# Patient Record
Sex: Female | Born: 1960 | Race: Black or African American | Hispanic: No | Marital: Single | Smoking: Never smoker
Health system: Southern US, Community
[De-identification: ages and names within clinical notes are randomized; demographics above are authoritative.]

## PROBLEM LIST (undated history)

## (undated) DIAGNOSIS — M1990 Unspecified osteoarthritis, unspecified site: Secondary | ICD-10-CM

## (undated) DIAGNOSIS — G43909 Migraine, unspecified, not intractable, without status migrainosus: Secondary | ICD-10-CM

## (undated) DIAGNOSIS — R7303 Prediabetes: Secondary | ICD-10-CM

---

## 1980-03-23 HISTORY — PX: BREAST SURGERY: SHX581

## 1997-12-26 ENCOUNTER — Emergency Department (HOSPITAL_COMMUNITY): Admission: EM | Admit: 1997-12-26 | Discharge: 1997-12-27 | Payer: Self-pay | Admitting: Emergency Medicine

## 1998-11-12 ENCOUNTER — Ambulatory Visit (HOSPITAL_COMMUNITY): Admission: RE | Admit: 1998-11-12 | Discharge: 1998-11-12 | Payer: Self-pay | Admitting: Obstetrics and Gynecology

## 1998-11-12 ENCOUNTER — Encounter: Payer: Self-pay | Admitting: Obstetrics and Gynecology

## 2000-05-28 ENCOUNTER — Encounter: Payer: Self-pay | Admitting: Obstetrics

## 2000-05-28 ENCOUNTER — Encounter: Admission: RE | Admit: 2000-05-28 | Discharge: 2000-05-28 | Payer: Self-pay | Admitting: Obstetrics

## 2001-11-16 ENCOUNTER — Encounter: Admission: RE | Admit: 2001-11-16 | Discharge: 2002-02-14 | Payer: Self-pay | Admitting: Obstetrics

## 2002-07-04 ENCOUNTER — Other Ambulatory Visit: Admission: RE | Admit: 2002-07-04 | Discharge: 2002-07-04 | Payer: Self-pay | Admitting: Gynecology

## 2002-12-19 ENCOUNTER — Encounter: Admission: RE | Admit: 2002-12-19 | Discharge: 2002-12-19 | Payer: Self-pay | Admitting: Obstetrics

## 2002-12-19 ENCOUNTER — Encounter: Payer: Self-pay | Admitting: Obstetrics

## 2003-01-18 ENCOUNTER — Encounter: Admission: RE | Admit: 2003-01-18 | Discharge: 2003-01-18 | Payer: Self-pay | Admitting: Cardiology

## 2003-06-20 ENCOUNTER — Encounter: Admission: RE | Admit: 2003-06-20 | Discharge: 2003-06-20 | Payer: Self-pay | Admitting: Cardiology

## 2004-02-27 ENCOUNTER — Encounter: Admission: RE | Admit: 2004-02-27 | Discharge: 2004-02-27 | Payer: Self-pay | Admitting: Cardiology

## 2004-02-28 ENCOUNTER — Other Ambulatory Visit: Admission: RE | Admit: 2004-02-28 | Discharge: 2004-02-28 | Payer: Self-pay | Admitting: Gynecology

## 2004-04-30 ENCOUNTER — Encounter: Admission: RE | Admit: 2004-04-30 | Discharge: 2004-04-30 | Payer: Self-pay | Admitting: Cardiology

## 2005-02-27 ENCOUNTER — Ambulatory Visit: Payer: Self-pay | Admitting: Internal Medicine

## 2005-05-06 ENCOUNTER — Other Ambulatory Visit: Admission: RE | Admit: 2005-05-06 | Discharge: 2005-05-06 | Payer: Self-pay | Admitting: Obstetrics and Gynecology

## 2005-05-07 ENCOUNTER — Encounter: Admission: RE | Admit: 2005-05-07 | Discharge: 2005-05-07 | Payer: Self-pay | Admitting: Obstetrics and Gynecology

## 2005-06-25 ENCOUNTER — Ambulatory Visit: Payer: Self-pay | Admitting: Internal Medicine

## 2005-07-02 ENCOUNTER — Emergency Department (HOSPITAL_COMMUNITY): Admission: EM | Admit: 2005-07-02 | Discharge: 2005-07-03 | Payer: Self-pay | Admitting: Emergency Medicine

## 2005-08-20 ENCOUNTER — Ambulatory Visit: Payer: Self-pay | Admitting: Internal Medicine

## 2006-08-06 IMAGING — US US ABDOMEN COMPLETE
1 series · 14 of 25 positions shown · non-contrast
Comparison: None.

CLINICAL DATA: Abdomen and right flank pain. 
 UPPER ABDOMINAL ULTRASOUND COMPLETE:

[Series 1: unknown · 0.27mm/px · 14 of 60 slices shown]
[im 1/60]
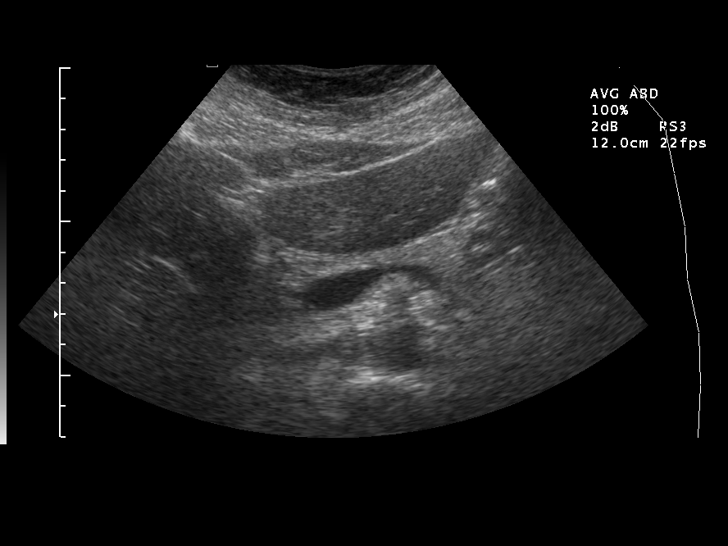
[im 5/60]
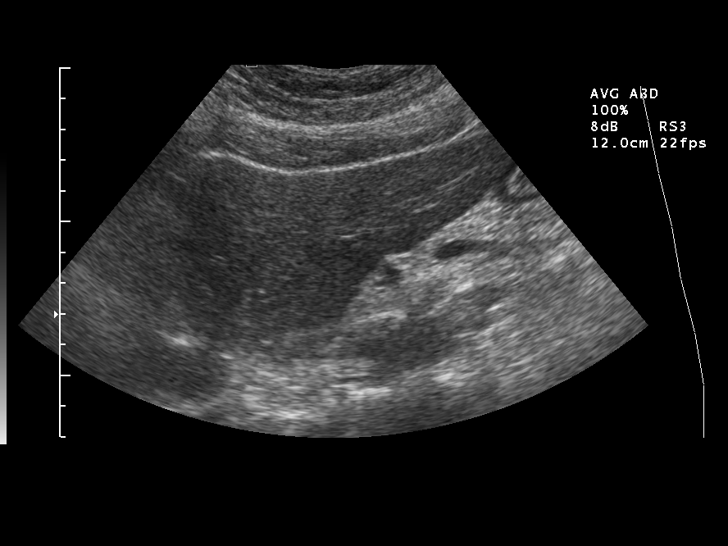
[im 10/60]
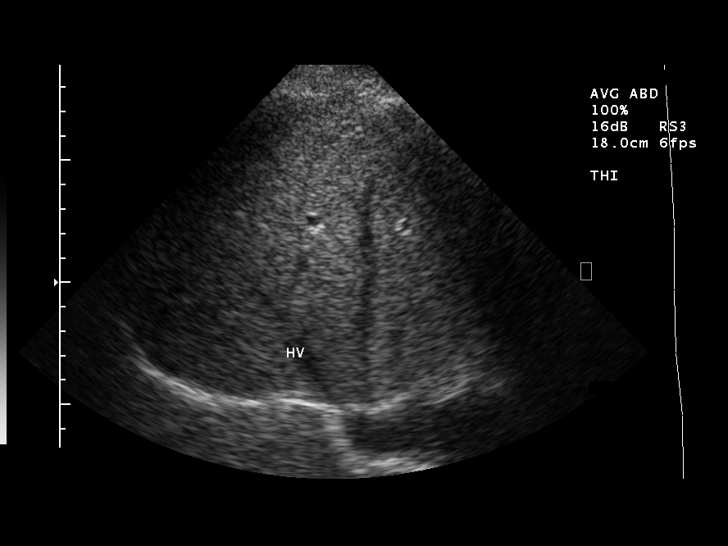
[im 15/60]
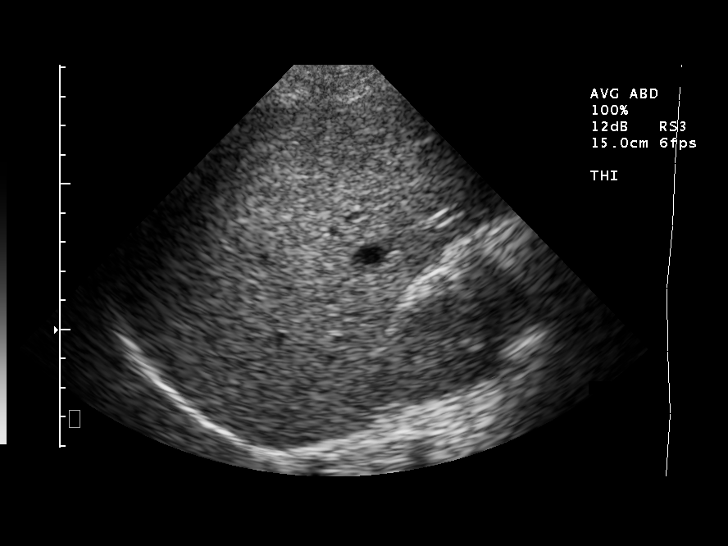
[im 20/60]
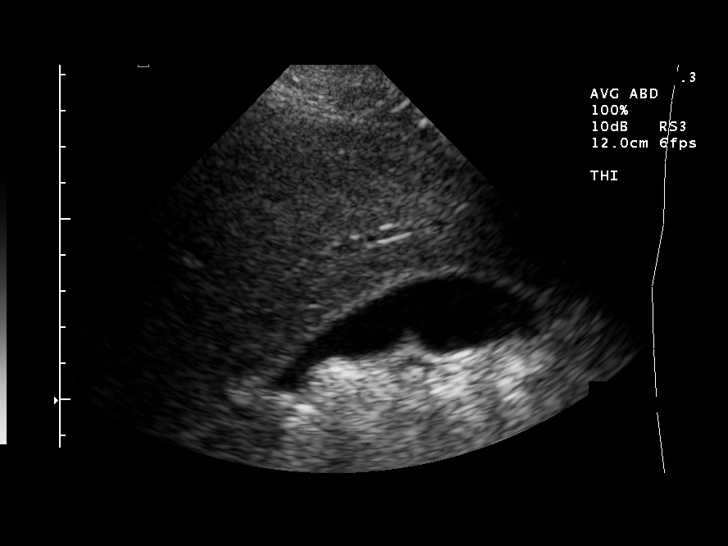
[im 23/60]
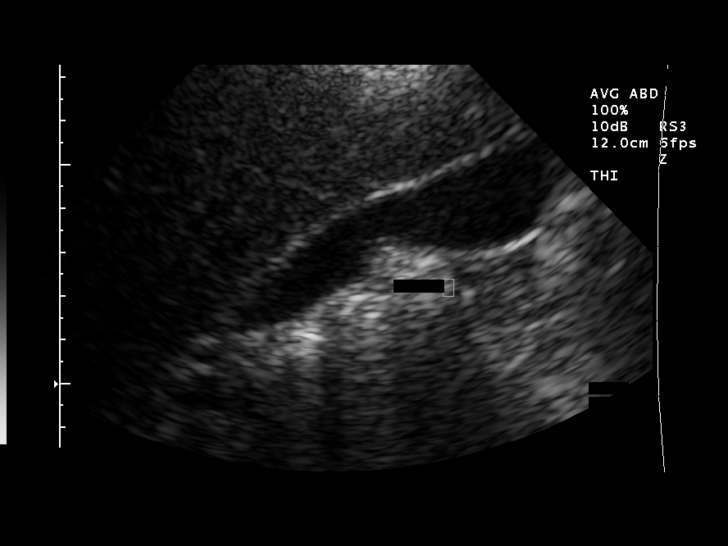
[im 28/60]
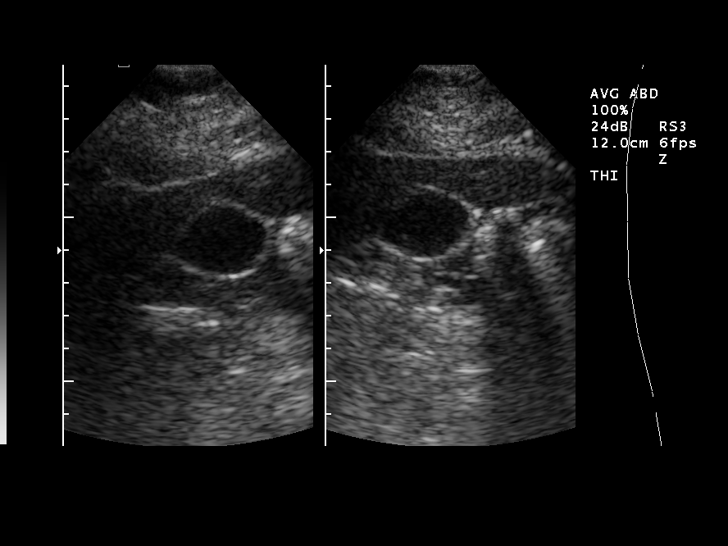
[im 32/60]
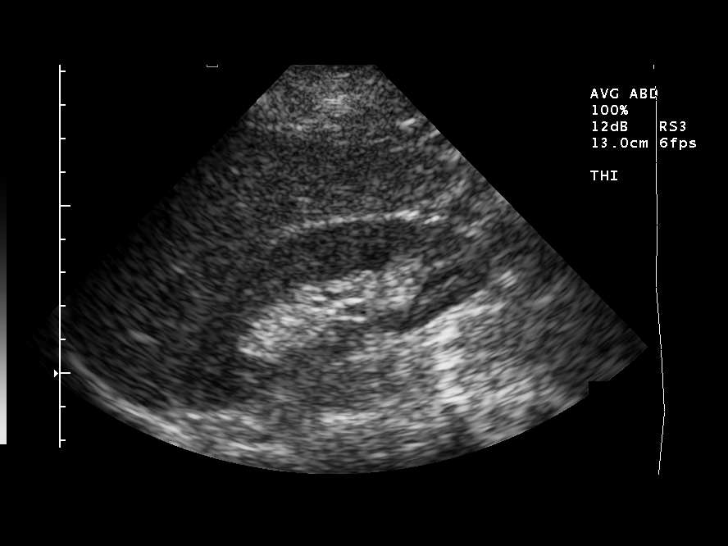
[im 37/60]
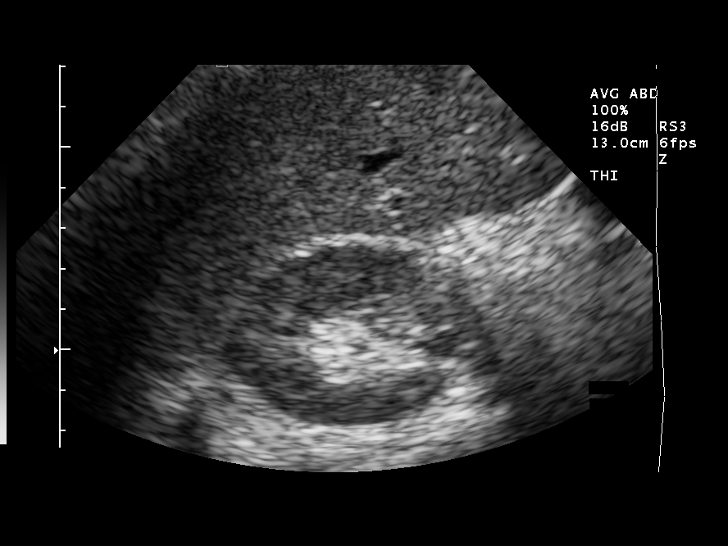
[im 40/60]
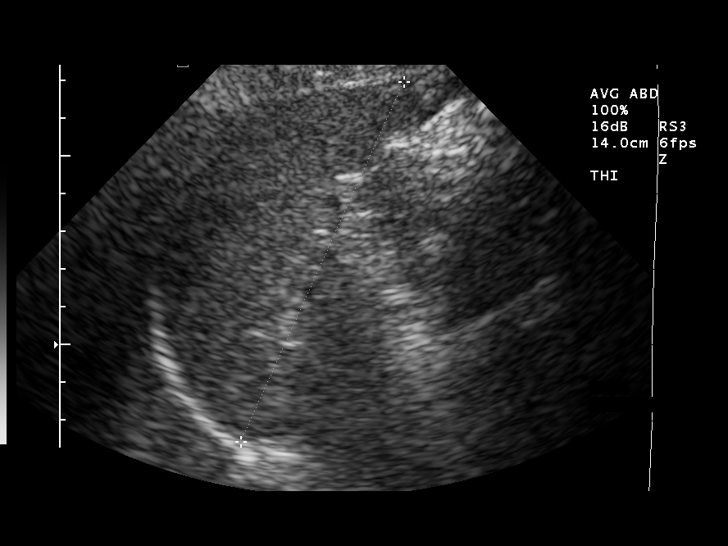
[im 45/60]
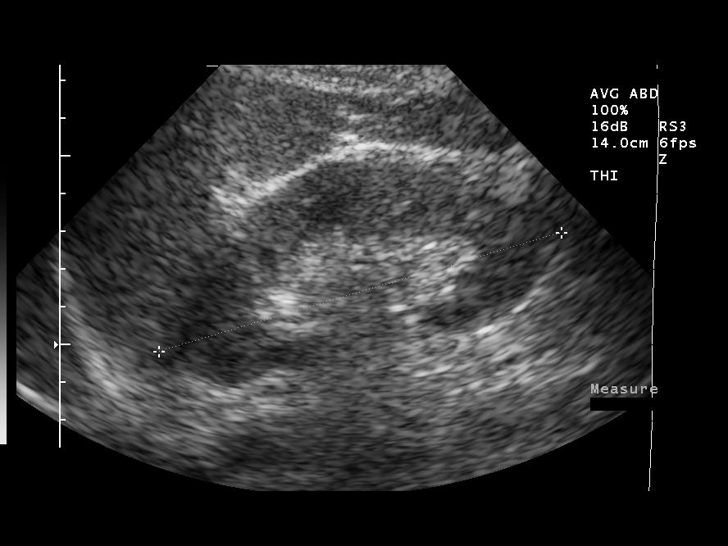
[im 50/60]
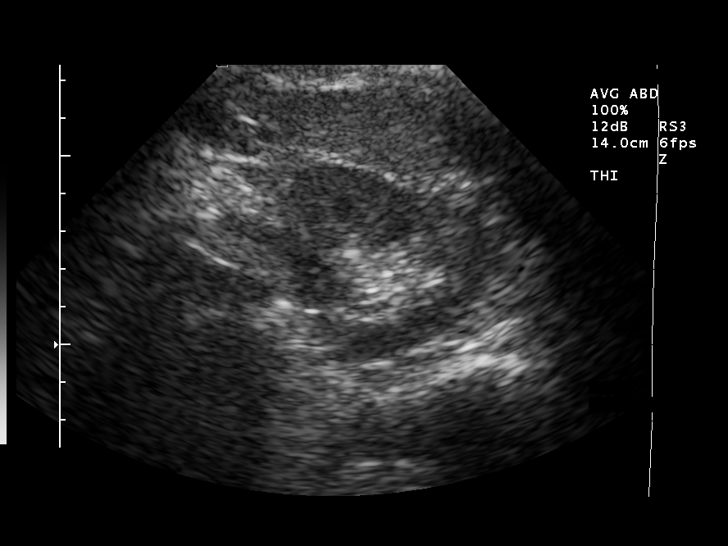
[im 55/60]
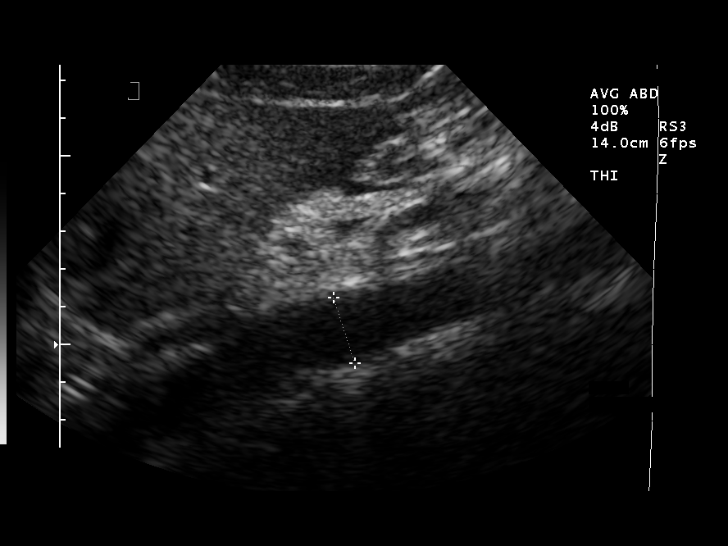
[im 60/60]
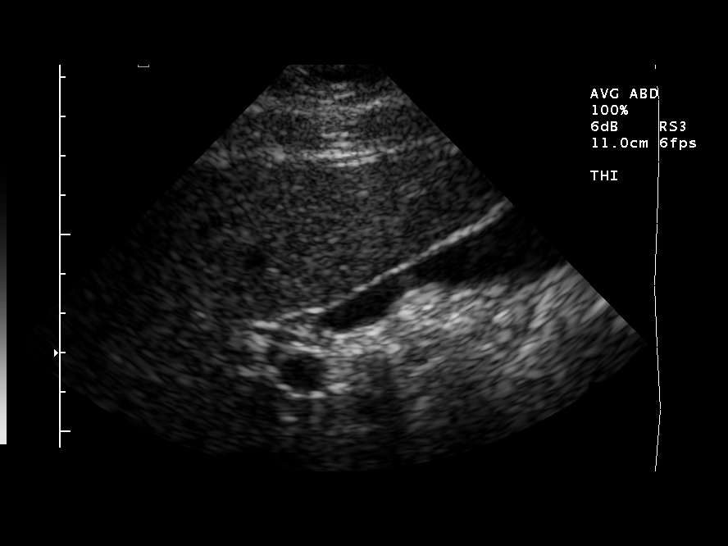

[14 of 25 positions shown; findings below may reference images not displayed]

FINDINGS: Gallbladder and biliary tree normal.  Common duct measured at 2.4 mm.  The liver, spleen, and pancreas appear normal.  Kidney size normal without stones or obstruction.  IVC and aorta normal.  No ascites.
IMPRESSION: Normal exam.

## 2007-11-26 ENCOUNTER — Emergency Department (HOSPITAL_COMMUNITY): Admission: EM | Admit: 2007-11-26 | Discharge: 2007-11-26 | Payer: Self-pay | Admitting: Emergency Medicine

## 2007-12-11 IMAGING — CT CT HEAD W/O CM
1 series · 16 of 30 positions shown, 20 images · IV contrast (agent unspecified)
Comparison: 02/27/04 MRI brain.

CLINICAL DATA: Migraine headaches, worse than normal.
 HEAD CT WITHOUT CONTRAST:
TECHNIQUE: Contiguous axial images were obtained from the base of the skull through the vertex according to standard protocol without contrast.

[Series 2: head_seq 4.5 h45s st · axial · 0.43mm/px · z∈[+1055,+1181]mm · 16 of 32 slices shown, 20 images]
[im 2/32  brain]
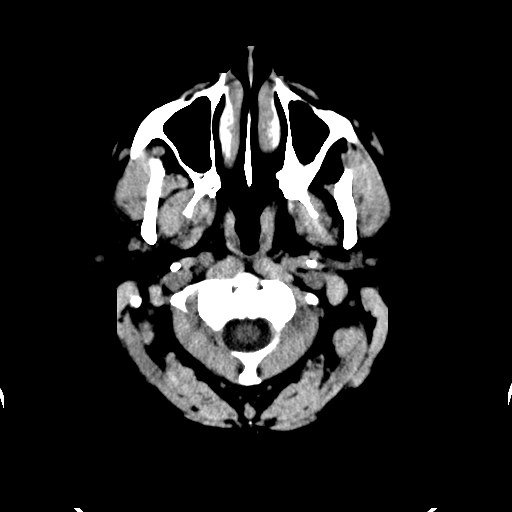
[im 2/32  bone]
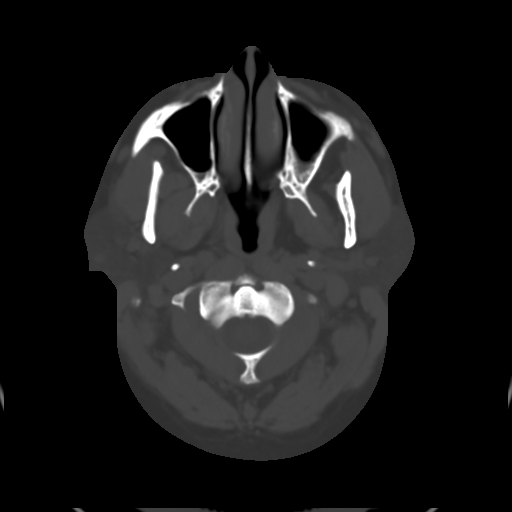
[im 4/32  brain]
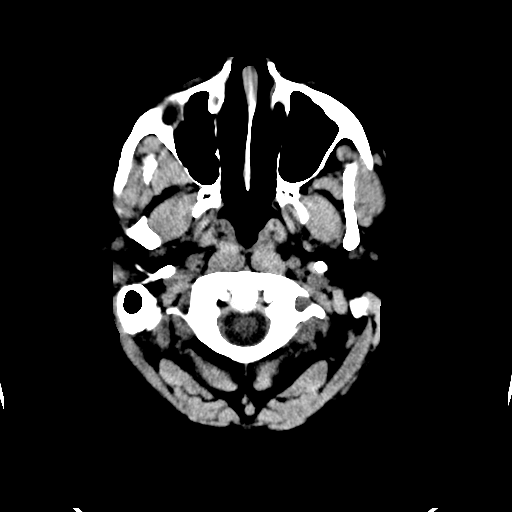
[im 6/32  brain]
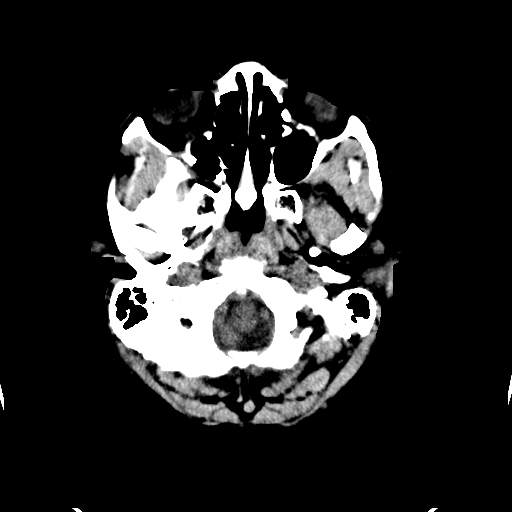
[im 8/32  brain]
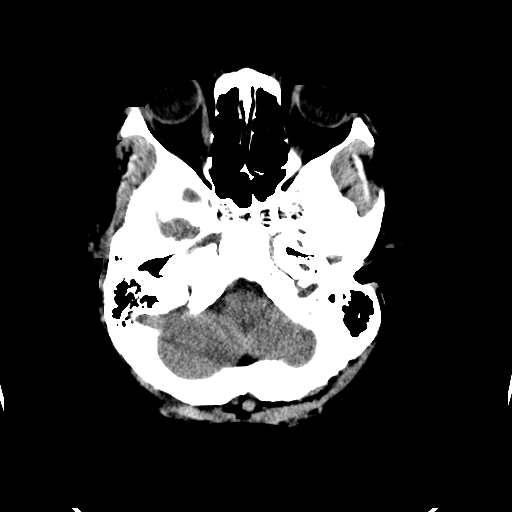
[im 9/32  brain]
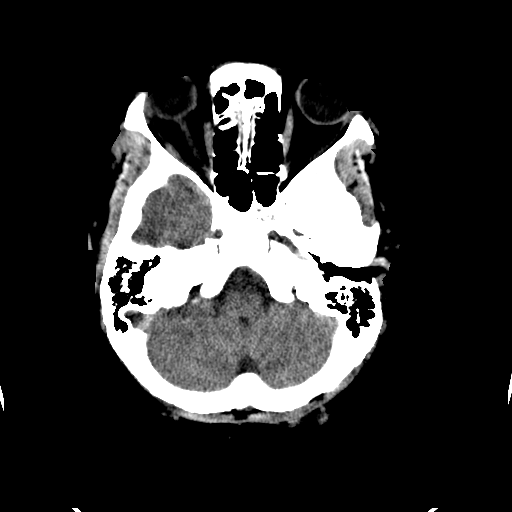
[im 9/32  bone]
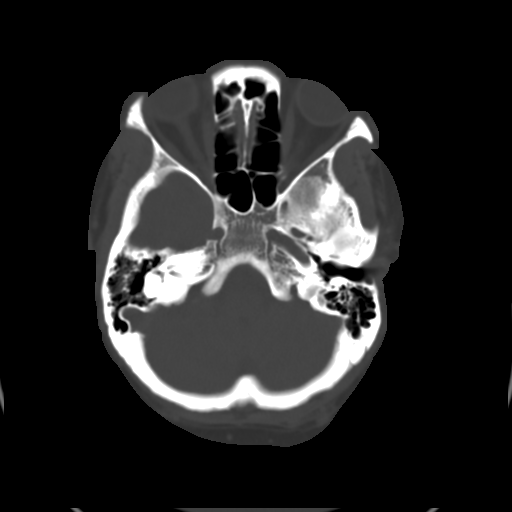
[im 11/32  brain]
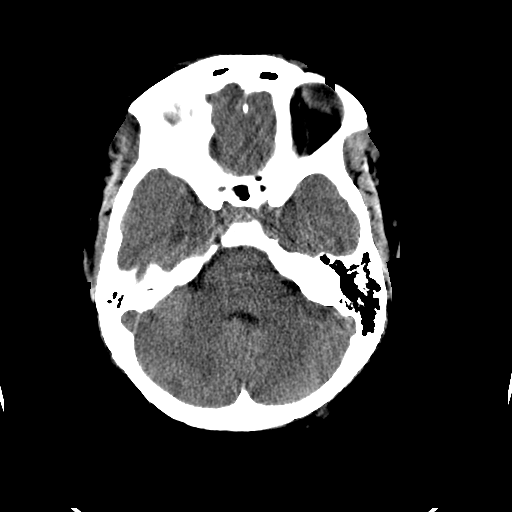
[im 13/32  brain]
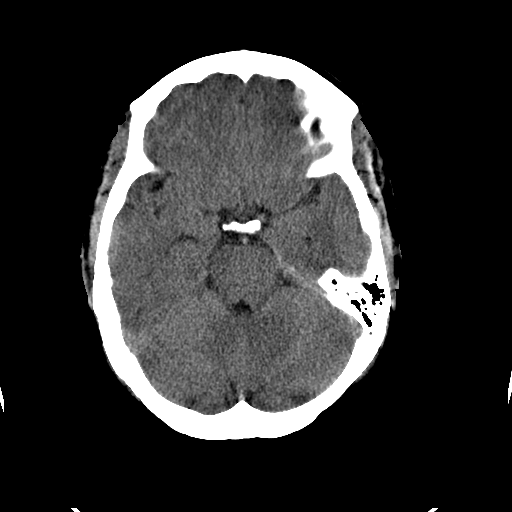
[im 15/32  brain]
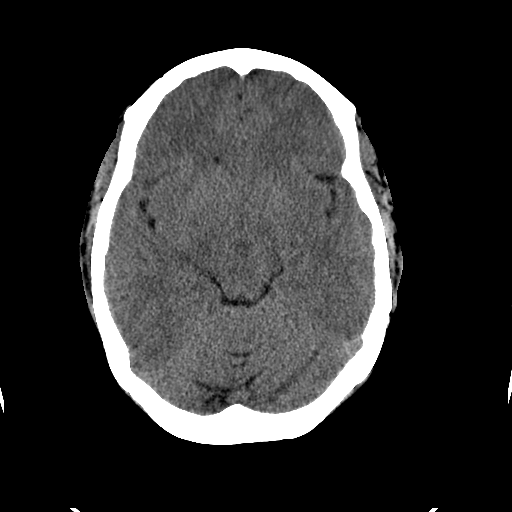
[im 17/32  brain]
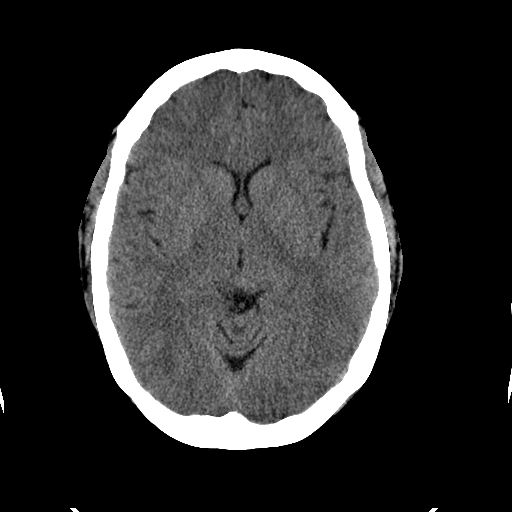
[im 17/32  bone]
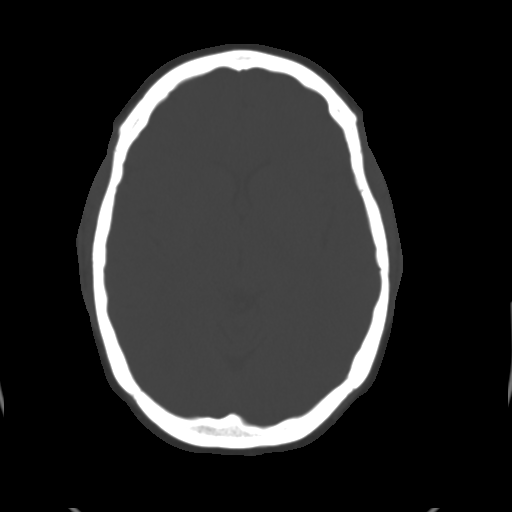
[im 19/32  brain]
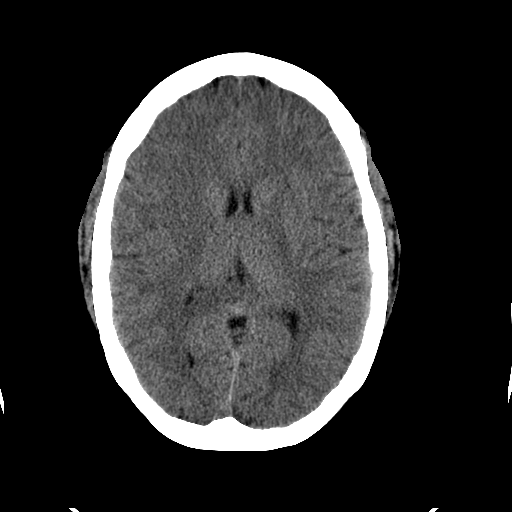
[im 21/32  brain]
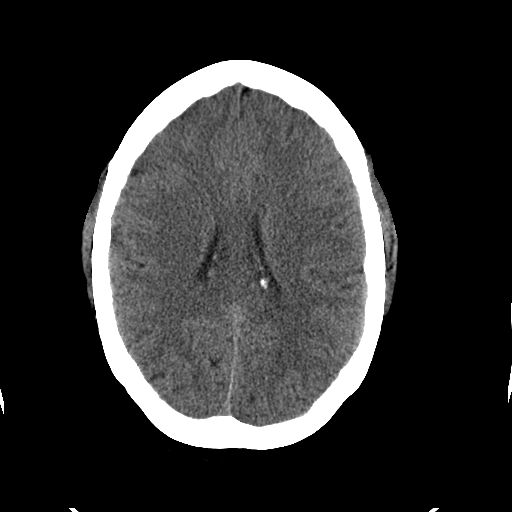
[im 23/32  brain]
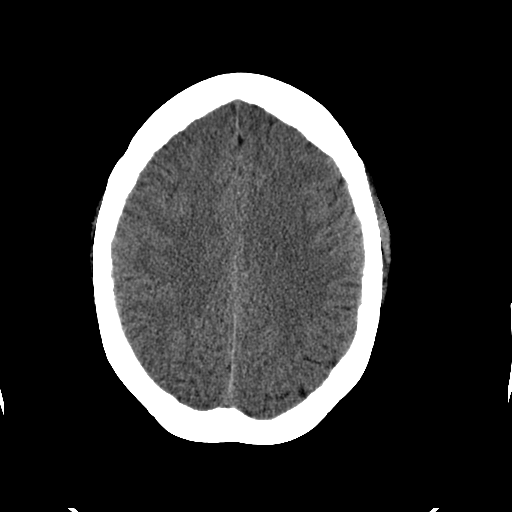
[im 24/32  brain]
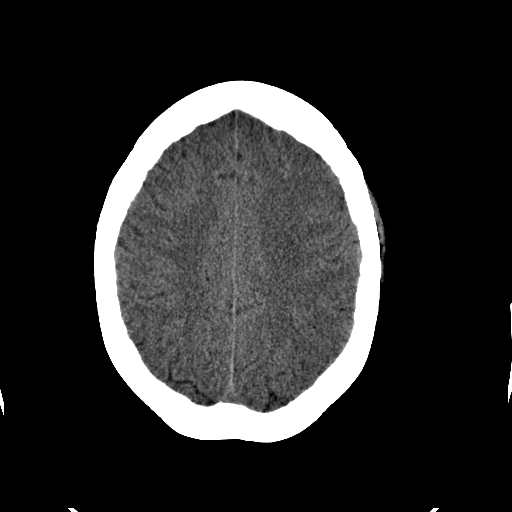
[im 24/32  bone]
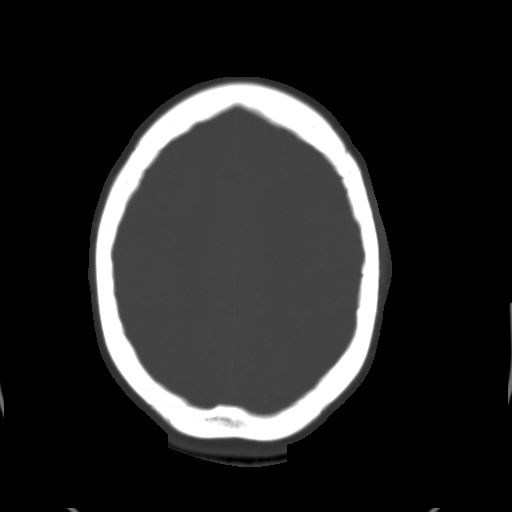
[im 26/32  brain]
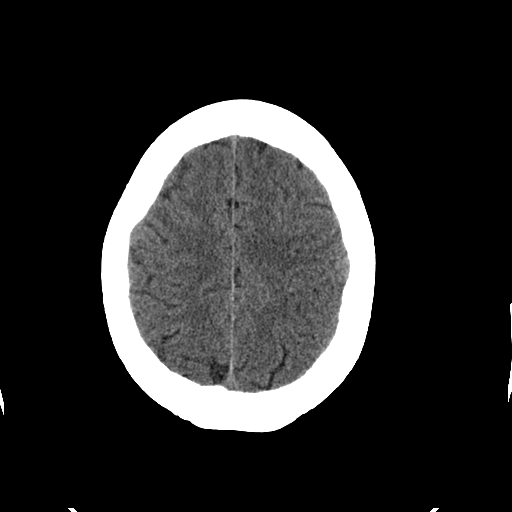
[im 28/32  brain]
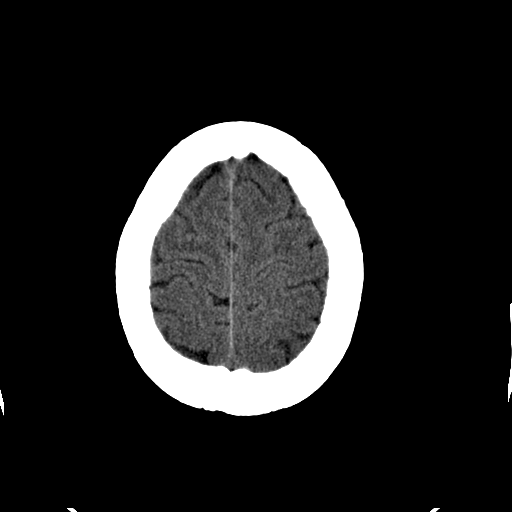
[im 30/32  brain]
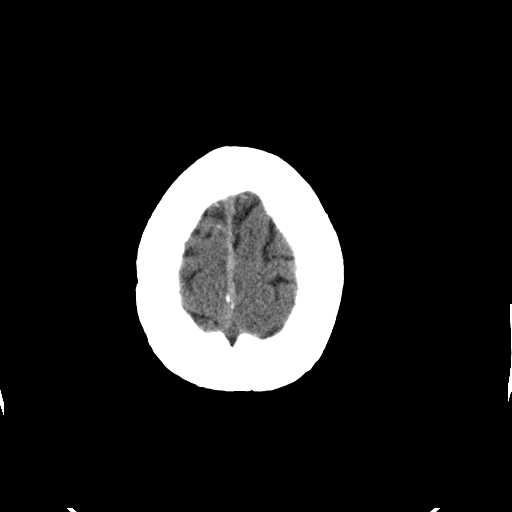

[16 of 30 positions shown; findings below may reference images not displayed]

FINDINGS: There is no evidence of intracranial hemorrhage, brain edema, or mass effect.  No other intra-axial abnormalities are seen, and the ventricles are within normal limits.  No abnormal extra-axial fluid collections or masses are identified.  No skull abnormalities are noted.
IMPRESSION: Negative non-contrast head CT.

## 2011-03-25 ENCOUNTER — Emergency Department (HOSPITAL_COMMUNITY)
Admission: EM | Admit: 2011-03-25 | Discharge: 2011-03-25 | Disposition: A | Discharge status: Home / Self Care | Payer: Managed Care, Other (non HMO) | Attending: Emergency Medicine | Admitting: Emergency Medicine

## 2011-03-25 DIAGNOSIS — H53149 Visual discomfort, unspecified: Secondary | ICD-10-CM | POA: Insufficient documentation

## 2011-03-25 DIAGNOSIS — G43909 Migraine, unspecified, not intractable, without status migrainosus: Secondary | ICD-10-CM

## 2011-03-25 DIAGNOSIS — R110 Nausea: Secondary | ICD-10-CM | POA: Insufficient documentation

## 2011-03-25 HISTORY — DX: Migraine, unspecified, not intractable, without status migrainosus: G43.909

## 2011-03-25 MED ORDER — PROMETHAZINE HCL 25 MG/ML IJ SOLN
12.5000 mg | Freq: Once | INTRAMUSCULAR | Status: AC
  Administered 2011-03-25: 12.5 mg via INTRAVENOUS
  Filled 2011-03-25: qty 1

## 2011-03-25 MED ORDER — KETOROLAC TROMETHAMINE 30 MG/ML IJ SOLN
30.0000 mg | Freq: Once | INTRAMUSCULAR | Status: AC
  Administered 2011-03-25: 30 mg via INTRAVENOUS
  Filled 2011-03-25: qty 1

## 2011-03-25 MED ORDER — DEXAMETHASONE SODIUM PHOSPHATE 10 MG/ML IJ SOLN
10.0000 mg | Freq: Once | INTRAMUSCULAR | Status: AC
  Administered 2011-03-25: 10 mg via INTRAVENOUS
  Filled 2011-03-25: qty 1

## 2011-03-25 MED ORDER — SODIUM CHLORIDE 0.9 % IV BOLUS (SEPSIS)
500.0000 mL | Freq: Once | INTRAVENOUS | Status: AC
  Administered 2011-03-25: 500 mL via INTRAVENOUS

## 2011-03-25 MED ORDER — DIPHENHYDRAMINE HCL 50 MG/ML IJ SOLN
12.5000 mg | Freq: Once | INTRAMUSCULAR | Status: AC
  Administered 2011-03-25: 12.5 mg via INTRAVENOUS
  Filled 2011-03-25: qty 1

## 2011-03-25 MED ORDER — METOCLOPRAMIDE HCL 5 MG/ML IJ SOLN
10.0000 mg | Freq: Once | INTRAMUSCULAR | Status: AC
  Administered 2011-03-25: 10 mg via INTRAVENOUS
  Filled 2011-03-25: qty 2

## 2011-03-25 NOTE — ED Provider Notes (Signed)
History     CSN: [id number]  Arrival date & time 03/25/11  4098   First MD Initiated Contact with Patient 03/25/11 1003      Chief Complaint  Patient presents with  . Migraine    (Consider location/radiation/quality/duration/timing/severity/associated sxs/prior treatment) Patient is a 51 y.o. female presenting with migraine. The history is provided by the patient.  Migraine The current episode started in the past 7 days. The problem occurs constantly. Associated symptoms include headaches and nausea. Pertinent negatives include no abdominal pain, chest pain, chills, congestion, fever, numbness, sore throat, visual change, vomiting or weakness. The symptoms are aggravated by nothing. She has tried nothing for the symptoms.  Constant migraine over left side of the head for two days. Took sumatriptan yesterday with no relief. Feels like previous migraine. Admits to sensetivity to light, sound, nausea. Denies vomiting. Denies any other symptoms.   Past Medical History  Diagnosis Date  . Migraine headache     History reviewed. No pertinent past surgical history.  No family history on file.  History  Substance Use Topics  . Smoking status: Not on file  . Smokeless tobacco: Not on file  . Alcohol Use: No    OB History    Grav Para Term Preterm Abortions TAB SAB Ect Mult Living                  Review of Systems  Constitutional: Negative for fever and chills.  HENT: Negative for ear pain, congestion and sore throat.   Eyes: Negative.   Respiratory: Negative.   Cardiovascular: Negative for chest pain.  Gastrointestinal: Positive for nausea. Negative for vomiting and abdominal pain.  Genitourinary: Negative.   Musculoskeletal: Negative.   Skin: Negative.   Neurological: Positive for headaches. Negative for dizziness, speech difficulty, weakness, light-headedness and numbness.  Psychiatric/Behavioral: Negative.     Allergies  Review of patient's allergies indicates no  known allergies.  Home Medications  No current outpatient prescriptions on file.  BP 137/74  Pulse 91  Temp(Src) 98.4 F (36.9 C) (Oral)  Resp 16  SpO2 100%  Physical Exam  Nursing note and vitals reviewed. Constitutional: She is oriented to person, place, and time. She appears well-developed and well-nourished. No distress.  HENT:  Head: Normocephalic and atraumatic.  Eyes: Conjunctivae and EOM are normal. Pupils are equal, round, and reactive to light.  Neck: Normal range of motion. Neck supple.       No meningismus  Cardiovascular: Normal rate, regular rhythm and normal heart sounds.   Pulmonary/Chest: Effort normal and breath sounds normal. No respiratory distress.  Abdominal: Soft. Bowel sounds are normal. There is no tenderness.  Musculoskeletal: Normal range of motion.  Neurological: She is alert and oriented to person, place, and time. She has normal reflexes.       Equal, 5/5 grip strength bilaterally, normal finger to nose  Skin: Skin is warm and dry. No erythema.  Psychiatric: She has a normal mood and affect.    ED Course  Procedures (including critical care time)  Pt give IV fluids, toradol. Reglan, decadron. Pt feeling better but still headache. Tried phenergan. Pt's pain improved. Pt ambulated down hallways. Ready to go home. Will d/c home with follow up. Pt's vs normal, neurologically intact.   MDM          Lottie Mussel, PA 03/25/11 2023

## 2011-03-25 NOTE — ED Notes (Signed)
Pt c/o migraine headache x2days with nausea, no vomiting

## 2011-03-26 NOTE — ED Provider Notes (Signed)
Medical screening examination/treatment/procedure(s) were performed by non-physician practitioner and as supervising physician I was immediately available for consultation/collaboration. Rozell Theiler Y.   Gavin Pound. Chayanne Speir, MD 03/26/11 1311

## 2017-04-08 ENCOUNTER — Other Ambulatory Visit: Payer: Self-pay | Admitting: Obstetrics and Gynecology

## 2017-05-07 ENCOUNTER — Other Ambulatory Visit (HOSPITAL_COMMUNITY): Payer: Managed Care, Other (non HMO)

## 2017-05-07 ENCOUNTER — Other Ambulatory Visit: Payer: Self-pay

## 2017-05-07 ENCOUNTER — Encounter (HOSPITAL_COMMUNITY): Payer: Self-pay | Admitting: *Deleted

## 2017-05-10 ENCOUNTER — Other Ambulatory Visit (HOSPITAL_COMMUNITY): Payer: Managed Care, Other (non HMO)

## 2017-05-11 ENCOUNTER — Encounter (HOSPITAL_COMMUNITY)
Admission: RE | Admit: 2017-05-11 | Discharge: 2017-05-11 | Disposition: A | Discharge status: Home / Self Care | Payer: Managed Care, Other (non HMO) | Source: Ambulatory Visit | Attending: Obstetrics and Gynecology | Admitting: Obstetrics and Gynecology

## 2017-05-11 NOTE — H&P (Signed)
Admission History and Physical Exam for a Gynecology Patient  [name] is a 57 y.o. female who presents for hysteroscopy and dilation and curettage for postmenopausal bleeding.  Pap smear of December 2018 was normal.  Gonorrhea and Chlamydia cultures were negative.  A hydro-sonohysterogram was performed and it showed an intramural fibroid. She has been followed at the [facility] and Gynecology division of [facility].  OB History    No data available      Past Medical History:  Diagnosis Date  . Arthritis    bilatera knees  . Migraine headache    chronic migrains  . Pre-diabetes     No medications prior to admission.    Past Surgical History:  Procedure Laterality Date  . BREAST SURGERY  1982   benign right breast cyst removed    No Known Allergies  Family History: family history is not on file.  Social History:  reports that  has never smoked. she has never used smokeless tobacco. She reports that she drinks alcohol. She reports that she does not use drugs.  Review of systems: See HPI.  Admission Physical Exam:    BMI equals 42.3.  There were no vitals taken for this visit.  HEENT:                 Within normal limits Chest:                   Clear Heart:                    Regular rate and rhythm Breasts:                No masses, skin changes, bleeding, or discharge present Abdomen:             Nontender, no masses Extremities:          Grossly normal Neurologic exam: Grossly normal  Pelvic exam:  External genitalia: normal general appearance Vaginal: normal without tenderness, induration or masses Cervix: normal appearance Adnexa: normal bimanual exam Uterus: Normal size and consistency  Assessment:  Postmenopausal bleeding  Fibroid uterus  Glucose intolerance  BMI equals 42.3  Plan:  The patient will undergo hysteroscopy with dilation and curettage.  She understands the indications for her  surgical procedure.  She accepts the risks of, but not limited to, anesthetic complications, bleeding, infection, and possible damage to the surrounding organs.   Eli Hose 05/11/2017

## 2017-05-11 NOTE — Anesthesia Preprocedure Evaluation (Addendum)
Anesthesia Evaluation  Patient identified by MRN, date of birth, ID band Patient awake    Reviewed: Allergy & Precautions, NPO status , Patient's Chart, lab work & pertinent test results  Airway Mallampati: III  TM Distance: >3 FB Neck ROM: Full    Dental no notable dental hx.    Pulmonary neg pulmonary ROS,    Pulmonary exam normal breath sounds clear to auscultation       Cardiovascular negative cardio ROS Normal cardiovascular exam Rhythm:Regular Rate:Normal     Neuro/Psych  Headaches, negative psych ROS   GI/Hepatic negative GI ROS, Neg liver ROS,   Endo/Other  diabetes, Oral Hypoglycemic AgentsMorbid obesity  Renal/GU negative Renal ROS     Musculoskeletal negative musculoskeletal ROS (+)   Abdominal (+) + obese,   Peds  Hematology  (+) anemia ,   Anesthesia Other Findings Post menopausal Bleeding  Reproductive/Obstetrics                           Anesthesia Physical Anesthesia Plan  ASA: III  Anesthesia Plan: General   Post-op Pain Management:    Induction: Intravenous  PONV Risk Score and Plan: 3 and Ondansetron, Dexamethasone and Midazolam  Airway Management Planned: LMA  Additional Equipment:   Intra-op Plan:   Post-operative Plan: Extubation in OR  Informed Consent: I have reviewed the patients History and Physical, chart, labs and discussed the procedure including the risks, benefits and alternatives for the proposed anesthesia with the patient or authorized representative who has indicated his/her understanding and acceptance.   Dental advisory given  Plan Discussed with: CRNA  Anesthesia Plan Comments:       Anesthesia Quick Evaluation

## 2017-05-12 ENCOUNTER — Encounter (HOSPITAL_COMMUNITY): Payer: Self-pay

## 2017-05-12 ENCOUNTER — Ambulatory Visit (HOSPITAL_COMMUNITY): Payer: 59 | Admitting: Anesthesiology

## 2017-05-12 ENCOUNTER — Other Ambulatory Visit: Payer: Self-pay

## 2017-05-12 ENCOUNTER — Ambulatory Visit (HOSPITAL_COMMUNITY)
Admission: RE | Admit: 2017-05-12 | Discharge: 2017-05-12 | Disposition: A | Discharge status: Home / Self Care | Payer: 59 | Source: Ambulatory Visit | Attending: Obstetrics and Gynecology | Admitting: Obstetrics and Gynecology

## 2017-05-12 ENCOUNTER — Encounter (HOSPITAL_COMMUNITY)
Admission: RE | Disposition: A | Discharge status: Home / Self Care | Payer: Self-pay | Source: Ambulatory Visit | Attending: Obstetrics and Gynecology

## 2017-05-12 DIAGNOSIS — N950 Postmenopausal bleeding: Secondary | ICD-10-CM | POA: Insufficient documentation

## 2017-05-12 DIAGNOSIS — Q5128 Other doubling of uterus, other specified: Secondary | ICD-10-CM | POA: Insufficient documentation

## 2017-05-12 DIAGNOSIS — D259 Leiomyoma of uterus, unspecified: Secondary | ICD-10-CM | POA: Insufficient documentation

## 2017-05-12 DIAGNOSIS — Z79899 Other long term (current) drug therapy: Secondary | ICD-10-CM | POA: Diagnosis not present

## 2017-05-12 DIAGNOSIS — N858 Other specified noninflammatory disorders of uterus: Secondary | ICD-10-CM | POA: Insufficient documentation

## 2017-05-12 DIAGNOSIS — Z6841 Body Mass Index (BMI) 40.0 and over, adult: Secondary | ICD-10-CM | POA: Insufficient documentation

## 2017-05-12 DIAGNOSIS — E119 Type 2 diabetes mellitus without complications: Secondary | ICD-10-CM | POA: Insufficient documentation

## 2017-05-12 DIAGNOSIS — Z7984 Long term (current) use of oral hypoglycemic drugs: Secondary | ICD-10-CM | POA: Diagnosis not present

## 2017-05-12 DIAGNOSIS — M170 Bilateral primary osteoarthritis of knee: Secondary | ICD-10-CM | POA: Diagnosis not present

## 2017-05-12 HISTORY — PX: DILATATION & CURRETTAGE/HYSTEROSCOPY WITH RESECTOCOPE: SHX5572

## 2017-05-12 HISTORY — DX: Unspecified osteoarthritis, unspecified site: M19.90

## 2017-05-12 HISTORY — DX: Prediabetes: R73.03

## 2017-05-12 LAB — CBC
HCT: 34.1 % — ABNORMAL LOW (ref 36.0–46.0)
Hemoglobin: 11.6 g/dL — ABNORMAL LOW (ref 12.0–15.0)
MCH: 31.4 pg (ref 26.0–34.0)
MCHC: 34 g/dL (ref 30.0–36.0)
MCV: 92.2 fL (ref 78.0–100.0)
PLATELETS: 379 10*3/uL (ref 150–400)
RBC: 3.7 MIL/uL — AB (ref 3.87–5.11)
RDW: 13.2 % (ref 11.5–15.5)
WBC: 11.1 10*3/uL — ABNORMAL HIGH (ref 4.0–10.5)

## 2017-05-12 LAB — BASIC METABOLIC PANEL
Anion gap: 9 (ref 5–15)
BUN: 25 mg/dL — AB (ref 6–20)
CO2: 23 mmol/L (ref 22–32)
CREATININE: 1.15 mg/dL — AB (ref 0.44–1.00)
Calcium: 8.8 mg/dL — ABNORMAL LOW (ref 8.9–10.3)
Chloride: 105 mmol/L (ref 101–111)
GFR calc Af Amer: >60 mL/min (ref >60)
GFR, EST NON AFRICAN AMERICAN: 52 mL/min — AB (ref >60)
Glucose, Bld: 170 mg/dL — ABNORMAL HIGH (ref 65–99)
Potassium: 4.1 mmol/L (ref 3.5–5.1)
SODIUM: 137 mmol/L (ref 135–145)

## 2017-05-12 LAB — GLUCOSE, CAPILLARY: Glucose-Capillary: 136 mg/dL — ABNORMAL HIGH (ref 65–99)

## 2017-05-12 SURGERY — DILATATION & CURETTAGE/HYSTEROSCOPY WITH RESECTOCOPE
Anesthesia: General | Site: Vagina

## 2017-05-12 MED ORDER — PROPOFOL 10 MG/ML IV BOLUS
INTRAVENOUS | Status: AC
  Filled 2017-05-12: qty 20

## 2017-05-12 MED ORDER — ACETAMINOPHEN 500 MG PO TABS
ORAL_TABLET | ORAL | Status: AC
  Filled 2017-05-12: qty 2

## 2017-05-12 MED ORDER — MIDAZOLAM HCL 2 MG/2ML IJ SOLN
INTRAMUSCULAR | Status: AC
  Filled 2017-05-12: qty 2

## 2017-05-12 MED ORDER — LACTATED RINGERS IV SOLN
INTRAVENOUS | Status: DC
  Administered 2017-05-12: 125 mL/h via INTRAVENOUS
  Administered 2017-05-12: 09:00:00 via INTRAVENOUS

## 2017-05-12 MED ORDER — SCOPOLAMINE 1 MG/3DAYS TD PT72
1.0000 | MEDICATED_PATCH | Freq: Once | TRANSDERMAL | Status: DC
  Administered 2017-05-12: 1.5 mg via TRANSDERMAL

## 2017-05-12 MED ORDER — FENTANYL CITRATE (PF) 100 MCG/2ML IJ SOLN
INTRAMUSCULAR | Status: AC
  Filled 2017-05-12: qty 2

## 2017-05-12 MED ORDER — IBUPROFEN 800 MG PO TABS: 800.0000 mg | ORAL_TABLET | Freq: Three times a day (TID) | ORAL | 1 refills | Status: AC | PRN

## 2017-05-12 MED ORDER — ACETAMINOPHEN 500 MG PO TABS
1000.0000 mg | ORAL_TABLET | Freq: Once | ORAL | Status: AC
  Administered 2017-05-12: 1000 mg via ORAL

## 2017-05-12 MED ORDER — ACETAMINOPHEN 160 MG/5ML PO SOLN: 960.0000 mg | Freq: Once | ORAL | Status: AC

## 2017-05-12 MED ORDER — OXYCODONE HCL 5 MG/5ML PO SOLN: 5.0000 mg | Freq: Once | ORAL | Status: DC | PRN

## 2017-05-12 MED ORDER — FENTANYL CITRATE (PF) 100 MCG/2ML IJ SOLN
INTRAMUSCULAR | Status: DC | PRN
  Administered 2017-05-12 (×2): 50 ug via INTRAVENOUS

## 2017-05-12 MED ORDER — OXYCODONE-ACETAMINOPHEN 2.5-325 MG PO TABS: 1.0000 | ORAL_TABLET | ORAL | 0 refills | Status: AC | PRN

## 2017-05-12 MED ORDER — DEXAMETHASONE SODIUM PHOSPHATE 10 MG/ML IJ SOLN
INTRAMUSCULAR | Status: DC | PRN
  Administered 2017-05-12: 4 mg via INTRAVENOUS

## 2017-05-12 MED ORDER — HYDROMORPHONE HCL 1 MG/ML IJ SOLN: 0.2500 mg | INTRAMUSCULAR | Status: DC | PRN

## 2017-05-12 MED ORDER — SODIUM CHLORIDE 0.9 % IR SOLN
Status: DC | PRN
  Administered 2017-05-12: 3000 mL

## 2017-05-12 MED ORDER — LIDOCAINE HCL (CARDIAC) 20 MG/ML IV SOLN
INTRAVENOUS | Status: DC | PRN
  Administered 2017-05-12: 50 mg via INTRAVENOUS

## 2017-05-12 MED ORDER — ONDANSETRON HCL 4 MG/2ML IJ SOLN
INTRAMUSCULAR | Status: AC
  Filled 2017-05-12: qty 2

## 2017-05-12 MED ORDER — EPHEDRINE 5 MG/ML INJ
INTRAVENOUS | Status: AC
  Filled 2017-05-12: qty 10

## 2017-05-12 MED ORDER — KETOROLAC TROMETHAMINE 30 MG/ML IJ SOLN
INTRAMUSCULAR | Status: DC | PRN
  Administered 2017-05-12: 30 mg via INTRAVENOUS

## 2017-05-12 MED ORDER — KETOROLAC TROMETHAMINE 30 MG/ML IJ SOLN
INTRAMUSCULAR | Status: AC
  Filled 2017-05-12: qty 1

## 2017-05-12 MED ORDER — DEXAMETHASONE SODIUM PHOSPHATE 4 MG/ML IJ SOLN
INTRAMUSCULAR | Status: AC
  Filled 2017-05-12: qty 1

## 2017-05-12 MED ORDER — EPHEDRINE SULFATE 50 MG/ML IJ SOLN
INTRAMUSCULAR | Status: DC | PRN
  Administered 2017-05-12: 10 mg via INTRAVENOUS

## 2017-05-12 MED ORDER — PROPOFOL 10 MG/ML IV BOLUS
INTRAVENOUS | Status: DC | PRN
  Administered 2017-05-12: 200 mg via INTRAVENOUS

## 2017-05-12 MED ORDER — MIDAZOLAM HCL 2 MG/2ML IJ SOLN
INTRAMUSCULAR | Status: DC | PRN
  Administered 2017-05-12: 2 mg via INTRAVENOUS

## 2017-05-12 MED ORDER — SCOPOLAMINE 1 MG/3DAYS TD PT72
MEDICATED_PATCH | TRANSDERMAL | Status: AC
  Administered 2017-05-12: 1.5 mg via TRANSDERMAL
  Filled 2017-05-12: qty 1

## 2017-05-12 MED ORDER — BUPIVACAINE-EPINEPHRINE 0.5% -1:200000 IJ SOLN
INTRAMUSCULAR | Status: DC | PRN
  Administered 2017-05-12: 10 mL

## 2017-05-12 MED ORDER — BUPIVACAINE-EPINEPHRINE (PF) 0.5% -1:200000 IJ SOLN
INTRAMUSCULAR | Status: AC
  Filled 2017-05-12: qty 30

## 2017-05-12 MED ORDER — PROMETHAZINE HCL 25 MG/ML IJ SOLN: 6.2500 mg | INTRAMUSCULAR | Status: DC | PRN

## 2017-05-12 MED ORDER — ONDANSETRON HCL 4 MG/2ML IJ SOLN
INTRAMUSCULAR | Status: DC | PRN
  Administered 2017-05-12: 4 mg via INTRAVENOUS

## 2017-05-12 MED ORDER — OXYCODONE HCL 5 MG PO TABS: 5.0000 mg | ORAL_TABLET | Freq: Once | ORAL | Status: DC | PRN

## 2017-05-12 SURGICAL SUPPLY — 18 items
BIPOLAR CUTTING LOOP 21FR (ELECTRODE)
CANISTER SUCT 3000ML PPV (MISCELLANEOUS) ×2 IMPLANT
CATH ROBINSON RED A/P 16FR (CATHETERS) ×2 IMPLANT
CLOTH BEACON ORANGE TIMEOUT ST (SAFETY) ×2 IMPLANT
ELECT REM PT RETURN 9FT ADLT (ELECTROSURGICAL)
ELECTRODE REM PT RTRN 9FT ADLT (ELECTROSURGICAL) IMPLANT
GLOVE BIOGEL PI IND STRL 7.0 (GLOVE) ×1 IMPLANT
GLOVE BIOGEL PI IND STRL 8.5 (GLOVE) ×1 IMPLANT
GLOVE BIOGEL PI INDICATOR 7.0 (GLOVE) ×1
GLOVE BIOGEL PI INDICATOR 8.5 (GLOVE) ×1
GLOVE ECLIPSE 8.0 STRL XLNG CF (GLOVE) ×4 IMPLANT
GOWN STRL REUS W/TWL LRG LVL3 (GOWN DISPOSABLE) ×4 IMPLANT
LOOP CUTTING BIPOLAR 21FR (ELECTRODE) IMPLANT
PACK VAGINAL MINOR WOMEN LF (CUSTOM PROCEDURE TRAY) ×2 IMPLANT
PAD OB MATERNITY 4.3X12.25 (PERSONAL CARE ITEMS) ×2 IMPLANT
TOWEL OR 17X24 6PK STRL BLUE (TOWEL DISPOSABLE) ×4 IMPLANT
TUBING AQUILEX INFLOW (TUBING) ×2 IMPLANT
TUBING AQUILEX OUTFLOW (TUBING) ×2 IMPLANT

## 2017-05-12 NOTE — Progress Notes (Signed)
The patient was interviewed and examined today.  The previously documented history and physical examination was reviewed. There are no changes. The operative procedure was reviewed. The risks and benefits were outlined again. The specific risks include, but are not limited to, anesthetic complications, bleeding, infections, and possible damage to the surrounding organs. The patient's questions were answered.  We are ready to proceed as outlined. The likelihood of the patient achieving the goals of this procedure is very likely.   Rebekah Reynolds, M.D.  

## 2017-05-12 NOTE — Discharge Instructions (Signed)
Hysteroscopy, Care After °Refer to this sheet in the next few weeks. These instructions provide you with information on caring for yourself after your procedure. Your health care provider may also give you more specific instructions. Your treatment has been planned according to current medical practices, but problems sometimes occur. Call your health care provider if you have any problems or questions after your procedure. °What can I expect after the procedure? °After your procedure, it is typical to have the following: °· You may have some cramping. This normally lasts for a couple days. °· You may have bleeding. This can vary from light spotting for a few days to menstrual-like bleeding for 3-7 days. ° °Follow these instructions at home: °· Rest for the first 1-2 days after the procedure. °· Only take over-the-counter or prescription medicines as directed by your health care provider. Do not take aspirin. It can increase the chances of bleeding. °· Take showers instead of baths for 2 weeks or as directed by your health care provider. °· Do not drive for 24 hours or as directed. °· Do not drink alcohol while taking pain medicine. °· Do not use tampons, douche, or have sexual intercourse for 2 weeks or until your health care provider says it is okay. °· Take your temperature twice a day for 4-5 days. Write it down each time. °· Follow your health care provider's advice about diet, exercise, and lifting. °· If you develop constipation, you may: °? Take a mild laxative if your health care provider approves. °? Add bran foods to your diet. °? Drink enough fluids to keep your urine clear or pale yellow. °· Try to have someone with you or available to you for the first 24-48 hours, especially if you were given a general anesthetic. °· Follow up with your health care provider as directed. °Contact a health care provider if: °· You feel dizzy or lightheaded. °· You feel sick to your stomach (nauseous). °· You have  abnormal vaginal discharge. °· You have a rash. °· You have pain that is not controlled with medicine. °Get help right away if: °· You have bleeding that is heavier than a normal menstrual period. °· You have a fever. °· You have increasing cramps or pain, not controlled with medicine. °· You have new belly (abdominal) pain. °· You pass out. °· You have pain in the tops of your shoulders (shoulder strap areas). °· You have shortness of breath. °This information is not intended to replace advice given to you by your health care provider. Make sure you discuss any questions you have with your health care provider. °Document Released: 12/28/2012 Document Revised: 08/15/2015 Document Reviewed: 10/06/2012 °Elsevier Interactive Patient Education © 2017 [employer] ° ° °Post Anesthesia Home Care Instructions ° °Activity: °Get plenty of rest for the remainder of the day. A responsible individual must stay with you for 24 hours following the procedure.  °For the next 24 hours, DO NOT: °-Drive a car °-Operate machinery °-Drink alcoholic beverages °-Take any medication unless instructed by your physician °-Make any legal decisions or sign important papers. ° °Meals: °Start with liquid foods such as gelatin or soup. Progress to regular foods as tolerated. Avoid greasy, spicy, heavy foods. If nausea and/or vomiting occur, drink only clear liquids until the nausea and/or vomiting subsides. Call your physician if vomiting continues. ° °Special Instructions/Symptoms: °Your throat may feel dry or sore from the anesthesia or the breathing tube placed in your throat during surgery. If this causes discomfort, gargle   discomfort, gargle with warm salt water. The discomfort should disappear within 24 hours.  If you had a scopolamine patch placed behind your ear for the management of post- operative nausea and/or vomiting:  1. The medication in the patch is effective for 72 hours, after which it should be removed.  Wrap patch in a tissue and  discard in the trash. Wash hands thoroughly with soap and water. 2. You may remove the patch earlier than 72 hours if you experience unpleasant side effects which may include dry mouth, dizziness or visual disturbances. 3. Avoid touching the patch. Wash your hands with soap and water after contact with the patch.     NO IBUPROFEN PRODUCTS (MOTRIN, ADVIL) OR ALEVE UNTIL 2:45PM TODAY.

## 2017-05-12 NOTE — Anesthesia Procedure Notes (Signed)
Procedure Name: LMA Insertion Date/Time: 05/12/2017 8:31 AM Performed by: Bufford Spikes, CRNA Pre-anesthesia Checklist: Patient identified, Emergency Drugs available, Suction available and Patient being monitored Patient Re-evaluated:Patient Re-evaluated prior to induction Oxygen Delivery Method: Circle system utilized Preoxygenation: Pre-oxygenation with 100% oxygen Induction Type: IV induction Ventilation: Mask ventilation without difficulty LMA: LMA inserted LMA Size: 4.0 Tube type: Oral Number of attempts: 1 Placement Confirmation: ETT inserted through vocal cords under direct vision,  positive ETCO2 and breath sounds checked- equal and bilateral Tube secured with: Tape Dental Injury: Teeth and Oropharynx as per pre-operative assessment

## 2017-05-12 NOTE — Transfer of Care (Signed)
Immediate Anesthesia Transfer of Care Note  Patient: Rebekah Reynolds  Procedure(s) Performed: DILATATION & CURETTAGE/HYSTEROSCOPY WITH RESECTOCOPE (N/A Vagina )  Patient Location: PACU  Anesthesia Type:General  Level of Consciousness: awake, alert  and oriented  Airway & Oxygen Therapy: Patient Spontanous Breathing and Patient connected to nasal cannula oxygen  Post-op Assessment: Report given to RN and Post -op Vital signs reviewed and stable  Post vital signs: Reviewed and stable  Last Vitals:  Vitals:   05/12/17 0620  BP: (!) 123/91  Pulse: 68  Resp: 16  Temp: 36.7 C  SpO2: 99%    Last Pain:  Vitals:   05/12/17 0755  TempSrc:   PainSc: 5       Patients Stated Pain Goal: 3 (13/08/65 7846)  Complications: No apparent anesthesia complications

## 2017-05-12 NOTE — Op Note (Signed)
OPERATIVE NOTE  Rebekah Reynolds  DOB:    1960/11/21  MRN:    [id number]  CSN:    [id number]  Date of Surgery:  05/12/2017  Preoperative Diagnosis:  Postmenopausal bleeding  BMI equals 42  Anemia  Fibroid uterus  Postoperative Diagnosis:  Postmenopausal bleeding  BMI equals 42  Anemia  Fibroid uterus  Endometrial septum  Procedure:  Hysteroscopy  Dilatation and curettage  Surgeon:  Gildardo Cranker, M.D.  Assistant:  None  Anesthetic:  General  Disposition:  The patient is a 57 y.o.-year-old female who presents with postmenopausal bleeding. She understands the indications for her surgical procedure. She accepts the risk of, but not limited to, anesthetic complications, bleeding, infections, and possible damage to the surrounding organs.  Findings:  On examination under anesthesia the uterus was normal size. No adnexal masses were appreciated. No parametrial disease was appreciated. The uterus sounded to 7 cm. The patient was noted to have an endometrial septum at the fundus of the uterus approximately one quarter of the length of the cavity.  Procedure:  The patient was taken to the operating room where a general anesthetic was given. The perineum and vagina were prepped with Betadine. The bladder was drained of urine. The patient was sterilely draped. Examination under anesthesia was performed. A paracervical block was placed using 10 cc of half percent Marcaine with epinephrine. An endocervical curettage was performed. The cervix was gently dilated. The diagnostic hysteroscope was inserted and the cavity was carefully inspected. Pictures were taken. Findings included: Normal tubal ostia.  A septum was present at the fundus of the uterus that extended approximately one quarter the length of the cavity.  No other pathology was noted.  The endometrium was thought to be atrophic. The diagnostic hysteroscope was removed. The cervix was dilated further.  The cavity was then curetted using a sharp curet. The cavity was felt to be clean at the end of our procedure. Hemostasis was adequate. All instruments were removed. The examination was repeated and the uterus was noted to be firm. Sponge, and needle counts were correct. The estimated blood loss for the procedure was 5 cc. The estimated fluid deficit loss 20 cc. The patient was awakened from her anesthetic without difficulty. She was returned to the supine position and and transported to the recovery room in stable condition. The endocervical curettings, and endometrial curettings were sent to pathology.  Followup instructions:  The patient will return to see Dr. Raphael Gibney in 2 weeks. She was given a copy of the postoperative instructions for patients who've undergone hysteroscopy.  Discharge medications:  Motrin 800 mg every 8 hours as needed for mild to moderate pain. Percocet one tablet every 4 hours as needed for severe pain.  Gildardo Cranker, M.D.  May 12, 2017 9:16 AM

## 2017-05-12 NOTE — Anesthesia Postprocedure Evaluation (Signed)
Anesthesia Post Note  Patient: Rebekah Reynolds  Procedure(s) Performed: DILATATION & CURETTAGE/HYSTEROSCOPY WITH RESECTOCOPE (N/A Vagina )     Patient location during evaluation: PACU Anesthesia Type: General Level of consciousness: awake and alert Pain management: pain level controlled Vital Signs Assessment: post-procedure vital signs reviewed and stable Respiratory status: spontaneous breathing, nonlabored ventilation, respiratory function stable and patient connected to nasal cannula oxygen Cardiovascular status: blood pressure returned to baseline and stable Postop Assessment: no apparent nausea or vomiting Anesthetic complications: no    Last Vitals:  Vitals:   05/12/17 1000 05/12/17 1048  BP: 120/78 (!) 141/90  Pulse: 76 74  Resp: 15 18  Temp: 36.4 C   SpO2: 97% 98%    Last Pain:  Vitals:   05/12/17 0906  TempSrc:   PainSc: 0-No pain   Pain Goal: Patients Stated Pain Goal: 3 (05/12/17 0620)               Karyl Kinnier Ellender

## 2017-05-13 ENCOUNTER — Encounter (HOSPITAL_COMMUNITY): Payer: Self-pay | Admitting: Obstetrics and Gynecology

## 2019-03-21 ENCOUNTER — Ambulatory Visit: Payer: Managed Care, Other (non HMO) | Attending: Internal Medicine

## 2019-03-21 DIAGNOSIS — Z20822 Contact with and (suspected) exposure to covid-19: Secondary | ICD-10-CM

## 2019-03-22 LAB — NOVEL CORONAVIRUS, NAA: SARS-CoV-2, NAA: NOT DETECTED

## 2019-04-07 ENCOUNTER — Other Ambulatory Visit: Payer: Managed Care, Other (non HMO)

## 2019-05-28 ENCOUNTER — Ambulatory Visit: Payer: Managed Care, Other (non HMO)

## 2019-05-28 ENCOUNTER — Ambulatory Visit: Payer: Managed Care, Other (non HMO) | Attending: Internal Medicine

## 2019-05-28 DIAGNOSIS — Z23 Encounter for immunization: Secondary | ICD-10-CM

## 2019-05-28 NOTE — Progress Notes (Signed)
   Covid-19 Vaccination Clinic  Name:  Rebekah Reynolds    MRN: [id number] DOB: 02/28/1961  05/28/2019  [name] was observed post Covid-19 immunization for 15 minutes without incident. She was provided with Vaccine Information Sheet and instruction to access the V-Safe system.   [name] was instructed to call 911 with any severe reactions post vaccine: Marland Kitchen Difficulty breathing  . Swelling of face and throat  . A fast heartbeat  . A bad rash all over body  . Dizziness and weakness   Immunizations Administered    Name Date Dose VIS Date Route   Pfizer COVID-19 Vaccine 05/28/2019  1:13 PM 0.3 mL 03/03/2019 Intramuscular   Manufacturer: [place]   Lot: [id number]   [place]: [id number]

## 2019-06-28 ENCOUNTER — Ambulatory Visit: Payer: Managed Care, Other (non HMO) | Attending: Internal Medicine

## 2019-06-28 DIAGNOSIS — Z23 Encounter for immunization: Secondary | ICD-10-CM

## 2019-06-28 NOTE — Progress Notes (Signed)
   Covid-19 Vaccination Clinic  Name:  Rebekah Reynolds    MRN: [id number] DOB: 10-18-1960  06/28/2019  Rebekah Reynolds was observed post Covid-19 immunization for 15 minutes without incident. She was provided with Vaccine Information Sheet and instruction to access the V-Safe system.   Rebekah Reynolds was instructed to call 911 with any severe reactions post vaccine: Marland Kitchen Difficulty breathing  . Swelling of face and throat  . A fast heartbeat  . A bad rash all over body  . Dizziness and weakness   Immunizations Administered    Name Date Dose VIS Date Route   Pfizer COVID-19 Vaccine 06/28/2019  9:10 AM 0.3 mL 03/03/2019 Intramuscular   Manufacturer: [employer], [employer]   Lot: [id number]   [place]: [id number]

## 2020-01-20 ENCOUNTER — Ambulatory Visit: Payer: Managed Care, Other (non HMO) | Attending: Internal Medicine

## 2020-01-20 DIAGNOSIS — Z23 Encounter for immunization: Secondary | ICD-10-CM

## 2020-01-20 NOTE — Progress Notes (Signed)
   Covid-19 Vaccination Clinic  Name:  ELESIA PEMBERTON    MRN: [id number] DOB: 11/02/1960  01/20/2020  Ms. Scarbrough was observed post Covid-19 immunization for 15 minutes without incident. She was provided with Vaccine Information Sheet and instruction to access the V-Safe system.   Ms. Laperle was instructed to call 911 with any severe reactions post vaccine: Marland Kitchen Difficulty breathing  . Swelling of face and throat  . A fast heartbeat  . A bad rash all over body  . Dizziness and weakness

## 2020-09-10 ENCOUNTER — Other Ambulatory Visit (HOSPITAL_BASED_OUTPATIENT_CLINIC_OR_DEPARTMENT_OTHER): Payer: Self-pay

## 2020-09-10 ENCOUNTER — Ambulatory Visit: Payer: Managed Care, Other (non HMO) | Attending: Internal Medicine

## 2020-09-10 DIAGNOSIS — Z23 Encounter for immunization: Secondary | ICD-10-CM

## 2020-09-10 MED ORDER — PFIZER-BIONT COVID-19 VAC-TRIS 30 MCG/0.3ML IM SUSP
INTRAMUSCULAR | 0 refills | Status: AC
  Filled 2020-09-10: qty 0.3, 1d supply, fill #0

## 2020-09-10 NOTE — Progress Notes (Signed)
   Covid-19 Vaccination Clinic  Name:  Rebekah Reynolds    MRN: [id number] DOB: Jul 23, 1960  09/10/2020  [name] was observed post Covid-19 immunization for 15 minutes without incident. She was provided with Vaccine Information Sheet and instruction to access the V-Safe system.   [name] was instructed to call 911 with any severe reactions post vaccine: Difficulty breathing  Swelling of face and throat  A fast heartbeat  A bad rash all over body  Dizziness and weakness   Immunizations Administered     Name Date Dose VIS Date Route   PFIZER Comrnaty(Gray TOP) Covid-19 Vaccine 09/10/2020  1:18 PM 0.3 mL 02/29/2020 Intramuscular   Manufacturer: [employer]   Lot: [id number]   [place]: [phone]

## 2021-04-29 ENCOUNTER — Other Ambulatory Visit: Payer: Self-pay

## 2021-04-29 ENCOUNTER — Encounter (HOSPITAL_BASED_OUTPATIENT_CLINIC_OR_DEPARTMENT_OTHER): Payer: Self-pay

## 2021-04-29 ENCOUNTER — Emergency Department (HOSPITAL_BASED_OUTPATIENT_CLINIC_OR_DEPARTMENT_OTHER)
Admission: EM | Admit: 2021-04-29 | Discharge: 2021-04-29 | Disposition: A | Discharge status: Home / Self Care | Payer: BLUE CROSS/BLUE SHIELD | Attending: Emergency Medicine | Admitting: Emergency Medicine

## 2021-04-29 DIAGNOSIS — R55 Syncope and collapse: Secondary | ICD-10-CM | POA: Insufficient documentation

## 2021-04-29 DIAGNOSIS — Z79899 Other long term (current) drug therapy: Secondary | ICD-10-CM | POA: Insufficient documentation

## 2021-04-29 DIAGNOSIS — N390 Urinary tract infection, site not specified: Secondary | ICD-10-CM

## 2021-04-29 LAB — URINALYSIS, ROUTINE W REFLEX MICROSCOPIC
Bilirubin Urine: NEGATIVE
Glucose, UA: NEGATIVE mg/dL
Hgb urine dipstick: NEGATIVE
Ketones, ur: NEGATIVE mg/dL
Nitrite: NEGATIVE
Specific Gravity, Urine: 1.021 (ref 1.005–1.030)
pH: 5 (ref 5.0–8.0)

## 2021-04-29 LAB — CBG MONITORING, ED: Glucose-Capillary: 133 mg/dL — ABNORMAL HIGH (ref 70–99)

## 2021-04-29 LAB — CBC
HCT: 39.2 % (ref 36.0–46.0)
Hemoglobin: 13.1 g/dL (ref 12.0–15.0)
MCH: 30.3 pg (ref 26.0–34.0)
MCHC: 33.4 g/dL (ref 30.0–36.0)
MCV: 90.5 fL (ref 80.0–100.0)
Platelets: 435 10*3/uL — ABNORMAL HIGH (ref 150–400)
RBC: 4.33 MIL/uL (ref 3.87–5.11)
RDW: 13.1 % (ref 11.5–15.5)
WBC: 9 10*3/uL (ref 4.0–10.5)
nRBC: 0 % (ref 0.0–0.2)

## 2021-04-29 LAB — BASIC METABOLIC PANEL
Anion gap: 10 (ref 5–15)
BUN: 20 mg/dL (ref 6–20)
CO2: 30 mmol/L (ref 22–32)
Calcium: 9.9 mg/dL (ref 8.9–10.3)
Chloride: 99 mmol/L (ref 98–111)
Creatinine, Ser: 1.29 mg/dL — ABNORMAL HIGH (ref 0.44–1.00)
GFR, Estimated: 48 mL/min — ABNORMAL LOW (ref >60)
Glucose, Bld: 137 mg/dL — ABNORMAL HIGH (ref 70–99)
Potassium: 4.2 mmol/L (ref 3.5–5.1)
Sodium: 139 mmol/L (ref 135–145)

## 2021-04-29 MED ORDER — CEPHALEXIN 500 MG PO CAPS: 500.0000 mg | ORAL_CAPSULE | Freq: Four times a day (QID) | ORAL | 0 refills | Status: DC

## 2021-04-29 NOTE — ED Provider Notes (Addendum)
[facility] Provider Note   CSN: [id number] Arrival date & time: 04/29/21  1639     History  Chief Complaint  Patient presents with   Loss of Consciousness    Rebekah Reynolds is a 61 y.o. female.  61 year old female presents with syncopal event today.  Patient states that she has been having these for several years since she was a teenager.  States the come and go and she is currently being worked up by her doctor for this.  Denies any history of recent volume loss.  No associated chest pain or shortness of breath.  No dizziness or palpitations.  Denies any positional component to it.  No recent changes to her medications.  Denies any seizure-like activity.  Denies any significant head trauma      Home Medications Prior to Admission medications   Medication Sig Start Date End Date Taking? Authorizing Provider  acetaminophen (TYLENOL) 120 MG suppository Place 120 mg rectally every 4 (four) hours as needed.    [provider]  AIMOVIG 70 MG/ML SOAJ Inject 70 mg into the skin every 30 (thirty) days. 03/01/17   [provider]  Butalbital-ASA-Caff-Codeine (ASCOMP-CODEINE PO) Take 1 tablet by mouth every 8 (eight) hours as needed. As needed for severe headache     [provider]  COVID-19 mRNA Vac-TriS, Pfizer, (PFIZER-BIONT COVID-19 VAC-TRIS) SUSP injection Inject into the muscle. 09/10/20   Carlyle Basques, MD  ibuprofen (ADVIL,MOTRIN) 800 MG tablet Take 1 tablet (800 mg total) by mouth every 8 (eight) hours as needed. 05/12/17   Ena Dawley, MD  meloxicam (MOBIC) 15 MG tablet Take 15 mg by mouth daily. 03/26/17   [provider]  metFORMIN (GLUCOPHAGE-XR) 500 MG 24 hr tablet Take 500 mg by mouth every morning. 03/01/17   [provider]  oxycodone-acetaminophen (PERCOCET) 2.5-325 MG tablet Take 1 tablet by mouth every 4 (four) hours as needed for pain. 05/12/17   Ena Dawley, MD  promethazine (PHENERGAN)  25 MG tablet Take 25 mg by mouth every 6 (six) hours as needed for nausea or vomiting.    [provider]  propranolol (INDERAL) 80 MG tablet Take 80 mg by mouth every morning.     [provider]  SUMAtriptan (IMITREX) 100 MG tablet Take 100 mg by mouth every 8 (eight) hours as needed. For migraine     [provider]  topiramate (TOPAMAX) 50 MG tablet Take 50 mg by mouth 3 (three) times daily.      [provider]  traMADol (ULTRAM) 50 MG tablet Take 50 mg by mouth every 6 (six) hours as needed for moderate pain.    [provider]  Vitamin D, Ergocalciferol, (DRISDOL) 50000 units CAPS capsule Take 50,000 Units by mouth once a week. 03/01/17   [provider]      Allergies    Patient has no known allergies.    Review of Systems   Review of Systems  All other systems reviewed and are negative.  Physical Exam Updated Vital Signs BP (!) 135/93    Pulse 93    Temp 97.8 F (36.6 C)    Resp 16    Ht 1.575 m (5\' 2" )    Wt 103.9 kg    SpO2 100%    BMI 41.90 kg/m  Physical Exam Vitals and nursing note reviewed.  Constitutional:      General: She is not in acute distress.    Appearance: Normal appearance. She is well-developed. She  is not toxic-appearing.  HENT:     Head: Normocephalic and atraumatic.  Eyes:     General: Lids are normal.     Conjunctiva/sclera: Conjunctivae normal.     Pupils: Pupils are equal, round, and reactive to light.  Neck:     Thyroid: No thyroid mass.     Trachea: No tracheal deviation.  Cardiovascular:     Rate and Rhythm: Normal rate and regular rhythm.     Heart sounds: Normal heart sounds. No murmur heard.   No gallop.  Pulmonary:     Effort: Pulmonary effort is normal. No respiratory distress.     Breath sounds: Normal breath sounds. No stridor. No decreased breath sounds, wheezing, rhonchi or rales.  Abdominal:     General: There is no distension.     Palpations: Abdomen is soft.     Tenderness:  There is no abdominal tenderness. There is no rebound.  Musculoskeletal:        General: No tenderness. Normal range of motion.     Cervical back: Normal range of motion and neck supple.  Skin:    General: Skin is warm and dry.     Findings: No abrasion or rash.  Neurological:     Mental Status: She is alert and oriented to person, place, and time. Mental status is at baseline.     GCS: GCS eye subscore is 4. GCS verbal subscore is 5. GCS motor subscore is 6.     Cranial Nerves: Cranial nerves 2-12 are intact. No cranial nerve deficit.     Sensory: No sensory deficit.     Motor: Motor function is intact.     Coordination: Coordination is intact.     Gait: Gait is intact.  Psychiatric:        Attention and Perception: Attention normal.        Speech: Speech normal.        Behavior: Behavior normal.    ED Results / Procedures / Treatments   Labs (all labs ordered are listed, but only abnormal results are displayed) Labs Reviewed  BASIC METABOLIC PANEL - Abnormal; Notable for the following components:      Result Value   Glucose, Bld 137 (*)    Creatinine, Ser 1.29 (*)    GFR, Estimated 48 (*)    All other components within normal limits  CBC - Abnormal; Notable for the following components:   Platelets 435 (*)    All other components within normal limits  URINALYSIS, ROUTINE W REFLEX MICROSCOPIC - Abnormal; Notable for the following components:   APPearance HAZY (*)    Protein, ur TRACE (*)    Leukocytes,Ua MODERATE (*)    Bacteria, UA MANY (*)    All other components within normal limits  CBG MONITORING, ED - Abnormal; Notable for the following components:   Glucose-Capillary 133 (*)    All other components within normal limits    EKG EKG Interpretation  Date/Time:  Tuesday April 29 2021 17:01:34 EST Ventricular Rate:  96 PR Interval:  116 QRS Duration: 72 QT Interval:  326 QTC Calculation: 411 R Axis:   70 Text Interpretation: Normal sinus rhythm Low voltage  QRS T wave abnormality, consider inferior ischemia Abnormal ECG No previous ECGs available Confirmed by Lacretia Leigh ([id number]) on 04/29/2021 5:52:42 PM  Radiology No results found.  Procedures Procedures    Medications Ordered in ED Medications - No data to display  ED Course/ Medical Decision Making/ A&P  Medical Decision Making Amount and/or Complexity of Data Reviewed Labs: ordered.  Risk Prescription drug management.   Patient is EKG per my interpretation without ischemic changes.  Sinus rhythm.  Patient's labs are reassuring here.  Urinalysis without evidence of ketones.  Patient was offered IV fluids here but has declined and will drink fluids when she gets home she states does appear to have some evidence of infection and will start patient on antibiotics.  Feel that this episode was likely vasovagal in nature.  Low suspicion for serious etiology such as subarachnoid hemorrhage or PE.  Do not think this was cardiac arrhythmia.  Patient will be discharged with antibiotics and follow-up with her doctor        Final Clinical Impression(s) / ED Diagnoses Final diagnoses:  None    Rx / DC Orders ED Discharge Orders     None         Lacretia Leigh, MD 04/29/21 1819    Lacretia Leigh, MD 04/29/21 [id number]

## 2021-04-29 NOTE — ED Triage Notes (Signed)
Patient here POV from Home with Syncope.  Last PM she felt lightheaded and dizzy. Patient states she went to go get something to eat and then remembers waking up on the floor.  Patient endorses Vaso-vagal incidents in the past and was referred to ED for Evaluation due to Syncope. Lightheaded currently.  NAD noted during Triage. A&Ox4. GCS 15. Ambulatory.

## 2022-03-20 ENCOUNTER — Other Ambulatory Visit (HOSPITAL_BASED_OUTPATIENT_CLINIC_OR_DEPARTMENT_OTHER): Payer: Self-pay

## 2022-03-20 MED ORDER — COMIRNATY 30 MCG/0.3ML IM SUSY
PREFILLED_SYRINGE | INTRAMUSCULAR | 0 refills | Status: AC
  Filled 2022-03-20: qty 0.3, 1d supply, fill #0

## 2023-06-15 ENCOUNTER — Telehealth: Payer: Self-pay | Admitting: Cardiology

## 2023-06-15 ENCOUNTER — Encounter: Payer: Self-pay | Admitting: Cardiology

## 2023-06-15 ENCOUNTER — Ambulatory Visit: Payer: Managed Care, Other (non HMO) | Attending: Cardiology | Admitting: Cardiology

## 2023-06-15 VITALS — BP 136/80 | HR 76 | Ht 62.0 in | Wt 186.2 lb

## 2023-06-15 DIAGNOSIS — Z87898 Personal history of other specified conditions: Secondary | ICD-10-CM

## 2023-06-15 DIAGNOSIS — I2510 Atherosclerotic heart disease of native coronary artery without angina pectoris: Secondary | ICD-10-CM

## 2023-06-15 DIAGNOSIS — Z79899 Other long term (current) drug therapy: Secondary | ICD-10-CM

## 2023-06-15 DIAGNOSIS — E119 Type 2 diabetes mellitus without complications: Secondary | ICD-10-CM

## 2023-06-15 DIAGNOSIS — Z7689 Persons encountering health services in other specified circumstances: Secondary | ICD-10-CM

## 2023-06-15 MED ORDER — FLUDROCORTISONE ACETATE 0.1 MG PO TABS: 0.1000 mg | ORAL_TABLET | Freq: Two times a day (BID) | ORAL | 3 refills | Status: DC

## 2023-06-15 MED ORDER — BLOOD PRESSURE MONITOR AUTOMAT DEVI: 1.0000 [IU] | Freq: Once | 0 refills | Status: DC

## 2023-06-15 MED ORDER — BLOOD PRESSURE MONITOR AUTOMAT DEVI: 1.0000 [IU] | Freq: Once | 0 refills | Status: AC

## 2023-06-15 NOTE — Patient Instructions (Addendum)
 Medication Instructions:  Your physician has recommended you make the following change in your medication:  START: Fludrocortisone (Florinef) 0.1 mg twice daily *If you need a refill on your cardiac medications before your next appointment, please call your pharmacy*   Lab Work: CMET, Mag, HgbA1c If you have labs (blood work) drawn today and your tests are completely normal, you will receive your results only by: MyChart Message (if you have MyChart) OR A paper copy in the mail If you have any lab test that is abnormal or we need to change your treatment, we will call you to review the results.   Testing/Procedures: Dr. Servando Salina has ordered a CT coronary calcium score.   Test locations:  [facility]  [facility]  This is $99 out of pocket.   Coronary CalciumScan A coronary calcium scan is an imaging test used to look for deposits of calcium and other fatty materials (plaques) in the inner lining of the blood vessels of the heart (coronary arteries). These deposits of calcium and plaques can partly clog and narrow the coronary arteries without producing any symptoms or warning signs. This puts a person at risk for a heart attack. This test can detect these deposits before symptoms develop. Tell a health care provider about: Any allergies you have. All medicines you are taking, including vitamins, herbs, eye drops, creams, and over-the-counter medicines. Any problems you or family members have had with anesthetic medicines. Any blood disorders you have. Any surgeries you have had. Any medical conditions you have. Whether you are pregnant or may be pregnant. What are the risks? Generally, this is a safe procedure. However, problems may occur, including: Harm to a pregnant woman and her unborn baby. This test involves the use of radiation. Radiation exposure can be dangerous to a pregnant woman  and her unborn baby. If you are pregnant, you generally should not have this procedure done. Slight increase in the risk of cancer. This is because of the radiation involved in the test. What happens before the procedure? No preparation is needed for this procedure. What happens during the procedure? You will undress and remove any jewelry around your neck or chest. You will put on a hospital gown. Sticky electrodes will be placed on your chest. The electrodes will be connected to an electrocardiogram (ECG) machine to record a tracing of the electrical activity of your heart. A CT scanner will take pictures of your heart. During this time, you will be asked to lie still and hold your breath for 2-3 seconds while a picture of your heart is being taken. The procedure may vary among health care providers and hospitals. What happens after the procedure? You can get dressed. You can return to your normal activities. It is up to you to get the results of your test. Ask your health care provider, or the department that is doing the test, when your results will be ready. Summary A coronary calcium scan is an imaging test used to look for deposits of calcium and other fatty materials (plaques) in the inner lining of the blood vessels of the heart (coronary arteries). Generally, this is a safe procedure. Tell your health care provider if you are pregnant or may be pregnant. No preparation is needed for this procedure. A CT scanner will take pictures of your heart. You can return to your normal activities after the scan is done. This information is not intended to replace  advice given to you by your health care provider. Make sure you discuss any questions you have with your health care provider. Document Released: 09/05/2007 Document Revised: 01/27/2016 Document Reviewed: 01/27/2016 Elsevier Interactive Patient Education  2017 [employer].    Follow-Up: At [facility], you and your  health needs are our priority.  As part of our continuing mission to provide you with exceptional heart care, we have created designated Provider Care Teams.  These Care Teams include your primary Cardiologist (physician) and Advanced Practice Providers (APPs -  Physician Assistants and Nurse Practitioners) who all work together to provide you with the care you need, when you need it.  We recommend signing up for the patient portal called "MyChart".  Sign up information is provided on this After Visit Summary.  MyChart is used to connect with patients for Virtual Visits (Telemedicine).  Patients are able to view lab/test results, encounter notes, upcoming appointments, etc.  Non-urgent messages can be sent to your provider as well.   To learn more about what you can do with MyChart, go to [email or web address].    Your next appointment:   12-16 week(s)  Provider:   Thomasene Ripple, DO    Other Instructions Please take your blood pressure daily for 2 weeks and send in a MyChart message. Please include heart rates. (One message at the end of the 2 weeks).   HOW TO TAKE YOUR BLOOD PRESSURE: Rest 5 minutes before taking your blood pressure. Don't smoke or drink caffeinated beverages for at least 30 minutes before. Take your blood pressure before (not after) you eat. Sit comfortably with your back supported and both feet on the floor (don't cross your legs). Elevate your arm to heart level on a table or a desk. Use the proper sized cuff. It should fit smoothly and snugly around your bare upper arm. There should be enough room to slip a fingertip under the cuff. The bottom edge of the cuff should be 1 inch above the crease of the elbow. Ideally, take 3 measurements at one sitting and record the average.

## 2023-06-15 NOTE — Telephone Encounter (Signed)
 Attempted to call patient, no answer, left message requesting call back.   Rx sent to [facility] on [address] [place]

## 2023-06-15 NOTE — Progress Notes (Signed)
 Cardiology Office Note:    Date:  06/15/2023   ID:  Rebekah Reynolds, DOB 09/09/1960, MRN [id number]  PCP:  Patient, No Pcp Per  Cardiologist:  Thomasene Ripple, DO  Electrophysiologist:  None   Referring MD: No ref. provider found   " I am having dizziness"  History of Present Illness:    Rebekah Reynolds is a 63 y.o. female with a hx of complicated migraine, Diabetes Mellitus , Primary hypertension, Mixed hyperlipidemia.   She presents with a chief complaint of syncope. She reports experiencing these episodes since her teenage years, but notes an increase in frequency recently. The episodes are characterized by a feeling of overheating, sweating, dizziness, and subsequent loss of consciousness. These episodes can occur in various situations, such as standing up from a seated position, walking from one room to another, or during physical activity like exercise. The patient reports feeling confused and disoriented upon regaining consciousness, which lasts for approximately ten minutes. There is no reported loss of bowel or bladder control during these episodes. The patient also mentions a history of migraines, for which she takes propranolol.  Chart review, she fell in [place] with Dr. Hal Hope, at her last visit in February 2024 he had continue her Florinef.  Unfortunately the patient has not been taking Florinef.  Past Medical History:  Diagnosis Date   Arthritis    bilatera knees   Migraine headache    chronic migrains   Pre-diabetes     Past Surgical History:  Procedure Laterality Date   BREAST SURGERY  1982   benign right breast cyst removed   DILATATION & CURRETTAGE/HYSTEROSCOPY WITH RESECTOCOPE N/A 05/12/2017   Procedure: DILATATION & CURETTAGE/HYSTEROSCOPY WITH RESECTOCOPE;  Surgeon: Kirkland Hun, MD;  Location: [facility];  Service: Gynecology;  Laterality: N/A;    Current Medications: Current Meds  Medication Sig   acetaminophen (TYLENOL) 120 MG suppository  Place 120 mg rectally every 4 (four) hours as needed.   acetaminophen-codeine (TYLENOL #3) 300-30 MG tablet Take 2 tablets by mouth every 6 (six) hours as needed for moderate pain (pain score 4-6).   Blood Pressure Monitoring (BLOOD PRESSURE MONITOR AUTOMAT) DEVI 1 Units by Does not apply route once for 1 dose.   busPIRone (BUSPAR) 10 MG tablet Take 10 mg by mouth daily.   Cholecalciferol 1.25 MG (50000 UT) capsule Take 50,000 Units by mouth daily.   EMGALITY 120 MG/ML SOAJ once a week.   fludrocortisone (FLORINEF) 0.1 MG tablet Take 1 tablet (0.1 mg total) by mouth 2 (two) times daily.   indomethacin (INDOCIN) 25 MG capsule Take 25 mg by mouth daily.   magnesium oxide (MAG-OX) 400 MG tablet Take 400 mg by mouth daily.   meclizine (ANTIVERT) 25 MG tablet Take 25 mg by mouth daily.   metFORMIN (GLUCOPHAGE-XR) 500 MG 24 hr tablet Take 500 mg by mouth every morning.   oxycodone-acetaminophen (PERCOCET) 2.5-325 MG tablet Take 1 tablet by mouth every 4 (four) hours as needed for pain.   promethazine (PHENERGAN) 25 MG tablet Take 25 mg by mouth every 6 (six) hours as needed for nausea or vomiting.   propranolol (INDERAL) 80 MG tablet Take 80 mg by mouth every morning.    Riboflavin 400 MG TABS daily.   rosuvastatin (CRESTOR) 20 MG tablet Take 20 mg by mouth at bedtime.   Semaglutide, 2 MG/DOSE, (OZEMPIC, 2 MG/DOSE,) 8 MG/3ML SOPN once a week.   SUMAtriptan (IMITREX) 100 MG tablet Take 100 mg by mouth every 8 (eight)  hours as needed. For migraine    SUMAtriptan 6 MG/0.5ML SOAJ as needed.   traMADol (ULTRAM) 50 MG tablet Take 50 mg by mouth every 6 (six) hours as needed for moderate pain.   UBRELVY 100 MG TABS as needed.     Allergies:   Patient has no known allergies.   Social History   Socioeconomic History   Marital status: Single    Spouse name: Not on file   Number of children: Not on file   Years of education: Not on file   Highest education level: Not on file  Occupational History    Not on file  Tobacco Use   Smoking status: Never   Smokeless tobacco: Never  Vaping Use   Vaping status: Never Used  Substance and Sexual Activity   Alcohol use: Yes    Comment: occasional   Drug use: No   Sexual activity: Not on file  Other Topics Concern   Not on file  Social History Narrative   Not on file   Social Drivers of Health   Financial Resource Strain: Not on file  Food Insecurity: Not on file  Transportation Needs: Not on file  Physical Activity: Not on file  Stress: Not on file  Social Connections: Not on file     Family History: The patient's family history is not on file.  ROS:   Review of Systems  Constitution: Negative for decreased appetite, fever and weight gain.  HENT: Negative for congestion, ear discharge, hoarse voice and sore throat.   Eyes: Negative for discharge, redness, vision loss in right eye and visual halos.  Cardiovascular: Negative for chest pain, dyspnea on exertion, leg swelling, orthopnea and palpitations.  Respiratory: Negative for cough, hemoptysis, shortness of breath and snoring.   Endocrine: Negative for heat intolerance and polyphagia.  Hematologic/Lymphatic: Negative for bleeding problem. Does not bruise/bleed easily.  Skin: Negative for flushing, nail changes, rash and suspicious lesions.  Musculoskeletal: Negative for arthritis, joint pain, muscle cramps, myalgias, neck pain and stiffness.  Gastrointestinal: Negative for abdominal pain, bowel incontinence, diarrhea and excessive appetite.  Genitourinary: Negative for decreased libido, genital sores and incomplete emptying.  Neurological: Negative for brief paralysis, focal weakness, headaches and loss of balance.  Psychiatric/Behavioral: Negative for altered mental status, depression and suicidal ideas.  Allergic/Immunologic: Negative for HIV exposure and persistent infections.    EKGs/Labs/Other Studies Reviewed:    The following studies were reviewed today:   EKG:   The ekg ordered today demonstrates   Echocardiogram 04/28/22  Left Ventricle: Normal LV systolic function, estimated EF 55 - 60%. Global longitudinal strain is normal with a value of -18.9%. Normal LV wall motion. Left ventricle size is normal. Normal LV wall thickness. Normal diastolic function. Tissue Doppler velocity is reduced. MV e' septal velocity is 5.6 cm/s.  Right Ventricle: Right ventricle size is normal. Normal systolic function. TAPSE is 2.2 cm.  Aortic Valve: Mild aortic regurgitation with a centrally directed jet.  Tricuspid Valve: Mild tricuspid regurgitation with a centrally directed jet. TR peak velocity is 2.5 m/s. Pulmonary artery pressure could not be estimated.  Aorta: Normal sized sinus of Valsalva. Mildly enlarged ascending aorta. Ascending Aorta measures 3.26 cm with a normal index of 1.8.  11/11/20 Normal left ventricular size and wall thickness. No regional wall motion  abnormality is seen. Left ventricular function is normal. LVEF visually  estimated at 55-69%. Diastolic filling pattern is normal. Global  longitudinal strain is normal (Avg. = - 22.4 %).  Normal right  ventricular size and function.  Normal left atrium by LA volume-index. There is no evidence of PFO by 2D  exam & agitated saline.  Normal right atrial size.    02/06/22   Indication for study was syncope  Patient was monitored for 13 days and 17 hours  The predominant underlying rhythm was sinus, long sinus minimum heart rate 52 bpm, average 83 bpm, max 168 bpm  There were rare PACs in isolation  There were rare PVCs in isolation  There was no VT or A-fib  There were 9 triggered events that all occurred during sinus rhythm or periods of artifact   Recent Labs: No results found for requested labs within last 365 days.  Recent Lipid Panel No results found for: "CHOL", "TRIG", "HDL", "CHOLHDL", "VLDL", "LDLCALC", "LDLDIRECT"  Physical Exam:    VS:  BP 136/80 (BP Location: Left Arm)   Pulse 76    Ht 5\' 2"  (1.575 m)   Wt 186 lb 3.2 oz (84.5 kg)   SpO2 96%   BMI 34.06 kg/m     Wt Readings from Last 3 Encounters:  06/15/23 186 lb 3.2 oz (84.5 kg)  04/29/21 229 lb 0.9 oz (103.9 kg)  05/12/17 229 lb 2 oz (103.9 kg)     GEN: Well nourished, well developed in no acute distress HEENT: Normal NECK: No JVD; No carotid bruits LYMPHATICS: No lymphadenopathy CARDIAC: S1S2 noted,RRR, no murmurs, rubs, gallops RESPIRATORY:  Clear to auscultation without rales, wheezing or rhonchi  ABDOMEN: Soft, non-tender, non-distended, +bowel sounds, no guarding. EXTREMITIES: No edema, No cyanosis, no clubbing MUSCULOSKELETAL:  No deformity  SKIN: Warm and dry NEUROLOGIC:  Alert and oriented x 3, non-focal PSYCHIATRIC:  Normal affect, good insight  ASSESSMENT:    1. Encounter to establish care   2. ASCVD (arteriosclerotic cardiovascular disease)   3. Medication management   4. DM type 2, goal A1C to be determined (HCC)   5. History of syncope    PLAN:    Syncope  Florinef previously effective but discontinued due to insurance. - Restart Florinef twice daily. - Monitor response and adjust dosage if necessary.  Hypertension Stage 1 hypertension with current readings of 138/80 mmHg and 140/86 mmHg. Propranolol used for migraines, not optimal for hypertension management. - Prescribe blood pressure cuff for home monitoring. - Educate on proper blood pressure monitoring techniques. - Review home blood pressure readings in 12-16 weeks.  Diabetes Mellitus Diabetes managed with Ozempic. No recent hemoglobin A1c test due to endocrinologist oversight. - Order hemoglobin A1c test. - Order kidney function tests.  Hyperlipidemia Elevated cholesterol with recent medication restart.  - Consider ordering coronary calcium score, for cardiovascular risk assessment.  The patient is in agreement with the above plan. The patient left the office in stable condition.  The patient will follow up  in   Medication Adjustments/Labs and Tests Ordered: Current medicines are reviewed at length with the patient today.  Concerns regarding medicines are outlined above.  Orders Placed This Encounter  Procedures   CT CARDIAC SCORING (SELF PAY ONLY)   Comprehensive Metabolic Panel (CMET)   Magnesium   Hemoglobin A1c   EKG 12-Lead   Meds ordered this encounter  Medications   fludrocortisone (FLORINEF) 0.1 MG tablet    Sig: Take 1 tablet (0.1 mg total) by mouth 2 (two) times daily.    Dispense:  180 tablet    Refill:  3   Blood Pressure Monitoring (BLOOD PRESSURE MONITOR AUTOMAT) DEVI    Sig: 1 Units by  Does not apply route once for 1 dose.    Dispense:  1 each    Refill:  0    Patient Instructions  Medication Instructions:  Your physician has recommended you make the following change in your medication:  START: Fludrocortisone (Florinef) 0.1 mg twice daily *If you need a refill on your cardiac medications before your next appointment, please call your pharmacy*   Lab Work: CMET, Mag, HgbA1c If you have labs (blood work) drawn today and your tests are completely normal, you will receive your results only by: MyChart Message (if you have MyChart) OR A paper copy in the mail If you have any lab test that is abnormal or we need to change your treatment, we will call you to review the results.   Testing/Procedures: Dr. Servando Salina has ordered a CT coronary calcium score.   Test locations:  [facility]  [place] [facility]  This is $99 out of pocket.   Coronary CalciumScan A coronary calcium scan is an imaging test used to look for deposits of calcium and other fatty materials (plaques) in the inner lining of the blood vessels of the heart (coronary arteries). These deposits of calcium and plaques can partly clog and narrow the coronary arteries without producing any symptoms or warning signs. This  puts a person at risk for a heart attack. This test can detect these deposits before symptoms develop. Tell a health care provider about: Any allergies you have. All medicines you are taking, including vitamins, herbs, eye drops, creams, and over-the-counter medicines. Any problems you or family members have had with anesthetic medicines. Any blood disorders you have. Any surgeries you have had. Any medical conditions you have. Whether you are pregnant or may be pregnant. What are the risks? Generally, this is a safe procedure. However, problems may occur, including: Harm to a pregnant woman and her unborn baby. This test involves the use of radiation. Radiation exposure can be dangerous to a pregnant woman and her unborn baby. If you are pregnant, you generally should not have this procedure done. Slight increase in the risk of cancer. This is because of the radiation involved in the test. What happens before the procedure? No preparation is needed for this procedure. What happens during the procedure? You will undress and remove any jewelry around your neck or chest. You will put on a hospital gown. Sticky electrodes will be placed on your chest. The electrodes will be connected to an electrocardiogram (ECG) machine to record a tracing of the electrical activity of your heart. A CT scanner will take pictures of your heart. During this time, you will be asked to lie still and hold your breath for 2-3 seconds while a picture of your heart is being taken. The procedure may vary among health care providers and hospitals. What happens after the procedure? You can get dressed. You can return to your normal activities. It is up to you to get the results of your test. Ask your health care provider, or the department that is doing the test, when your results will be ready. Summary A coronary calcium scan is an imaging test used to look for deposits of calcium and other fatty materials (plaques) in  the inner lining of the blood vessels of the heart (coronary arteries). Generally, this is a safe procedure. Tell your health care provider if you are pregnant or may be pregnant. No preparation is needed for this procedure.  A CT scanner will take pictures of your heart. You can return to your normal activities after the scan is done. This information is not intended to replace advice given to you by your health care provider. Make sure you discuss any questions you have with your health care provider. Document Released: 09/05/2007 Document Revised: 01/27/2016 Document Reviewed: 01/27/2016 Elsevier Interactive Patient Education  2017 [employer].    Follow-Up: At [facility], you and your health needs are our priority.  As part of our continuing mission to provide you with exceptional heart care, we have created designated Provider Care Teams.  These Care Teams include your primary Cardiologist (physician) and Advanced Practice Providers (APPs -  Physician Assistants and Nurse Practitioners) who all work together to provide you with the care you need, when you need it.  We recommend signing up for the patient portal called "MyChart".  Sign up information is provided on this After Visit Summary.  MyChart is used to connect with patients for Virtual Visits (Telemedicine).  Patients are able to view lab/test results, encounter notes, upcoming appointments, etc.  Non-urgent messages can be sent to your provider as well.   To learn more about what you can do with MyChart, go to [email or web address].    Your next appointment:   12-16 week(s)  Provider:   Thomasene Ripple, DO    Other Instructions Please take your blood pressure daily for 2 weeks and send in a MyChart message. Please include heart rates. (One message at the end of the 2 weeks).   HOW TO TAKE YOUR BLOOD PRESSURE: Rest 5 minutes before taking your blood pressure. Don't smoke or drink caffeinated beverages for at least 30  minutes before. Take your blood pressure before (not after) you eat. Sit comfortably with your back supported and both feet on the floor (don't cross your legs). Elevate your arm to heart level on a table or a desk. Use the proper sized cuff. It should fit smoothly and snugly around your bare upper arm. There should be enough room to slip a fingertip under the cuff. The bottom edge of the cuff should be 1 inch above the crease of the elbow. Ideally, take 3 measurements at one sitting and record the average.        Adopting a Healthy Lifestyle.  Know what a healthy weight is for you (roughly BMI <25) and aim to maintain this   Aim for 7+ servings of fruits and vegetables daily   65-80+ fluid ounces of water or unsweet tea for healthy kidneys   Limit to max 1 drink of alcohol per day; avoid smoking/tobacco   Limit animal fats in diet for cholesterol and heart health - choose grass fed whenever available   Avoid highly processed foods, and foods high in saturated/trans fats   Aim for low stress - take time to unwind and care for your mental health   Aim for 150 min of moderate intensity exercise weekly for heart health, and weights twice weekly for bone health   Aim for 7-9 hours of sleep daily   When it comes to diets, agreement about the perfect plan isnt easy to find, even among the experts. Experts at the [facility] of [employer] developed an idea known as the Healthy Eating Plate. Just imagine a plate divided into logical, healthy portions.   The emphasis is on diet quality:   Load up on vegetables and fruits - one-half of your plate: Aim for color and variety, and  remember that potatoes dont count.   Go for whole grains - one-quarter of your plate: Whole wheat, barley, wheat berries, quinoa, oats, brown rice, and foods made with them. If you want pasta, go with whole wheat pasta.   Protein power - one-quarter of your plate: Fish, chicken, beans, and nuts are all  healthy, versatile protein sources. Limit red meat.   The diet, however, does go beyond the plate, offering a few other suggestions.   Use healthy plant oils, such as olive, canola, soy, corn, sunflower and peanut. Check the labels, and avoid partially hydrogenated oil, which have unhealthy trans fats.   If youre thirsty, drink water. Coffee and tea are good in moderation, but skip sugary drinks and limit milk and dairy products to one or two daily servings.   The type of carbohydrate in the diet is more important than the amount. Some sources of carbohydrates, such as vegetables, fruits, whole grains, and beans-are healthier than others.   Finally, stay active  Osvaldo Shipper, DO  06/15/2023 10:33 AM    [facility]

## 2023-06-15 NOTE — Telephone Encounter (Signed)
 Pt c/o medication issue:  1. Name of Medication: fludrocortisone (FLORINEF) 0.1 MG tablet  Blood Pressure Monitoring (BLOOD PRESSURE MONITOR AUTOMAT) DEVI  2. How are you currently taking this medication (dosage and times per day)? As written  3. Are you having a reaction (difficulty breathing--STAT)? No   4. What is your medication issue? These were sent to the wrong pharmacy. I have updated the correct pharmacy please re-send

## 2023-06-16 LAB — COMPREHENSIVE METABOLIC PANEL
ALT: 11 IU/L (ref 0–32)
AST: 15 IU/L (ref 0–40)
Albumin: 4.2 g/dL (ref 3.9–4.9)
Alkaline Phosphatase: 84 IU/L (ref 44–121)
BUN/Creatinine Ratio: 12 (ref 12–28)
BUN: 12 mg/dL (ref 8–27)
Bilirubin Total: 0.3 mg/dL (ref 0.0–1.2)
CO2: 24 mmol/L (ref 20–29)
Calcium: 9.6 mg/dL (ref 8.7–10.3)
Chloride: 105 mmol/L (ref 96–106)
Creatinine, Ser: 1.03 mg/dL — ABNORMAL HIGH (ref 0.57–1.00)
Globulin, Total: 2.5 g/dL (ref 1.5–4.5)
Glucose: 87 mg/dL (ref 70–99)
Potassium: 4.8 mmol/L (ref 3.5–5.2)
Sodium: 143 mmol/L (ref 134–144)
Total Protein: 6.7 g/dL (ref 6.0–8.5)
eGFR: 61 mL/min/{1.73_m2} (ref >59)

## 2023-06-16 LAB — HEMOGLOBIN A1C
Est. average glucose Bld gHb Est-mCnc: 103 mg/dL
Hgb A1c MFr Bld: 5.2 % (ref 4.8–5.6)

## 2023-06-16 LAB — MAGNESIUM: Magnesium: 1.9 mg/dL (ref 1.6–2.3)

## 2023-06-16 NOTE — Telephone Encounter (Signed)
 Patient identification verified by 2 forms. Marilynn Rail, RN    Called and spoke to patient  Informed patient requested prescription sent to pharmacy  Patient has no further questions or concerns at this time

## 2023-06-21 ENCOUNTER — Encounter: Payer: Self-pay | Admitting: Cardiology

## 2023-06-21 ENCOUNTER — Other Ambulatory Visit (HOSPITAL_COMMUNITY): Payer: Self-pay

## 2023-06-21 MED ORDER — BLOOD PRESSURE CUFF MISC
1.0000 [IU] | Freq: Once | 0 refills | Status: AC
  Filled 2023-06-21: qty 1, 30d supply, fill #0

## 2023-06-23 ENCOUNTER — Other Ambulatory Visit (HOSPITAL_COMMUNITY): Payer: Self-pay

## 2023-06-24 ENCOUNTER — Encounter: Payer: Self-pay | Admitting: Cardiology

## 2023-06-24 ENCOUNTER — Other Ambulatory Visit (HOSPITAL_COMMUNITY): Payer: Self-pay

## 2023-06-25 MED ORDER — FLUDROCORTISONE ACETATE 0.1 MG PO TABS: 0.2000 mg | ORAL_TABLET | Freq: Two times a day (BID) | ORAL | 3 refills | Status: AC

## 2023-07-02 ENCOUNTER — Other Ambulatory Visit (HOSPITAL_COMMUNITY)

## 2023-07-02 ENCOUNTER — Ambulatory Visit (HOSPITAL_COMMUNITY)
Admission: RE | Admit: 2023-07-02 | Discharge: 2023-07-02 | Disposition: A | Discharge status: Home / Self Care | Payer: Self-pay | Source: Ambulatory Visit | Attending: Cardiology | Admitting: Cardiology

## 2023-07-02 ENCOUNTER — Encounter (HOSPITAL_COMMUNITY): Payer: Self-pay

## 2023-07-02 DIAGNOSIS — I2510 Atherosclerotic heart disease of native coronary artery without angina pectoris: Secondary | ICD-10-CM | POA: Insufficient documentation

## 2023-07-06 ENCOUNTER — Encounter: Payer: Self-pay | Admitting: Cardiology

## 2023-08-22 ENCOUNTER — Other Ambulatory Visit: Payer: Self-pay

## 2023-08-22 ENCOUNTER — Emergency Department (HOSPITAL_BASED_OUTPATIENT_CLINIC_OR_DEPARTMENT_OTHER)
Admission: EM | Admit: 2023-08-22 | Discharge: 2023-08-22 | Disposition: A | Discharge status: Home / Self Care | Attending: Emergency Medicine | Admitting: Emergency Medicine

## 2023-08-22 ENCOUNTER — Encounter (HOSPITAL_BASED_OUTPATIENT_CLINIC_OR_DEPARTMENT_OTHER): Payer: Self-pay | Admitting: Emergency Medicine

## 2023-08-22 ENCOUNTER — Emergency Department (HOSPITAL_BASED_OUTPATIENT_CLINIC_OR_DEPARTMENT_OTHER)

## 2023-08-22 DIAGNOSIS — N132 Hydronephrosis with renal and ureteral calculous obstruction: Secondary | ICD-10-CM | POA: Diagnosis not present

## 2023-08-22 DIAGNOSIS — N201 Calculus of ureter: Secondary | ICD-10-CM

## 2023-08-22 DIAGNOSIS — R350 Frequency of micturition: Secondary | ICD-10-CM | POA: Diagnosis present

## 2023-08-22 LAB — BASIC METABOLIC PANEL WITH GFR
Anion gap: 11 (ref 5–15)
BUN: 14 mg/dL (ref 8–23)
CO2: 26 mmol/L (ref 22–32)
Calcium: 9.9 mg/dL (ref 8.9–10.3)
Chloride: 103 mmol/L (ref 98–111)
Creatinine, Ser: 1.05 mg/dL — ABNORMAL HIGH (ref 0.44–1.00)
GFR, Estimated: 60 mL/min — ABNORMAL LOW (ref >60)
Glucose, Bld: 101 mg/dL — ABNORMAL HIGH (ref 70–99)
Potassium: 4.1 mmol/L (ref 3.5–5.1)
Sodium: 140 mmol/L (ref 135–145)

## 2023-08-22 LAB — CBC WITH DIFFERENTIAL/PLATELET
Abs Immature Granulocytes: 0.02 K/uL (ref 0.00–0.07)
Basophils Absolute: 0 K/uL (ref 0.0–0.1)
Basophils Relative: 1 %
Eosinophils Absolute: 0.1 K/uL (ref 0.0–0.5)
Eosinophils Relative: 2 %
HCT: 36.6 % (ref 36.0–46.0)
Hemoglobin: 12.1 g/dL (ref 12.0–15.0)
Immature Granulocytes: 0 %
Lymphocytes Relative: 29 %
Lymphs Abs: 2.3 K/uL (ref 0.7–4.0)
MCH: 31.5 pg (ref 26.0–34.0)
MCHC: 33.1 g/dL (ref 30.0–36.0)
MCV: 95.3 fL (ref 80.0–100.0)
Monocytes Absolute: 0.5 K/uL (ref 0.1–1.0)
Monocytes Relative: 7 %
Neutro Abs: 5 K/uL (ref 1.7–7.7)
Neutrophils Relative %: 61 %
Platelets: 355 K/uL (ref 150–400)
RBC: 3.84 MIL/uL — ABNORMAL LOW (ref 3.87–5.11)
RDW: 13.3 % (ref 11.5–15.5)
WBC: 8 K/uL (ref 4.0–10.5)
nRBC: 0 % (ref 0.0–0.2)

## 2023-08-22 LAB — URINALYSIS, ROUTINE W REFLEX MICROSCOPIC
Bilirubin Urine: NEGATIVE
Glucose, UA: NEGATIVE mg/dL
Ketones, ur: NEGATIVE mg/dL
Leukocytes,Ua: NEGATIVE
Nitrite: NEGATIVE
Protein, ur: NEGATIVE mg/dL
Specific Gravity, Urine: 1.014 (ref 1.005–1.030)
pH: 5.5 (ref 5.0–8.0)

## 2023-08-22 MED ORDER — SODIUM CHLORIDE 0.9 % IV BOLUS
1000.0000 mL | Freq: Once | INTRAVENOUS | Status: AC
  Administered 2023-08-22: 1000 mL via INTRAVENOUS

## 2023-08-22 MED ORDER — TAMSULOSIN HCL 0.4 MG PO CAPS
0.4000 mg | ORAL_CAPSULE | Freq: Every day | ORAL | 0 refills | Status: DC
Start: 2023-08-22 — End: 2023-08-22

## 2023-08-22 MED ORDER — OXYCODONE-ACETAMINOPHEN 5-325 MG PO TABS
1.0000 | ORAL_TABLET | Freq: Four times a day (QID) | ORAL | 0 refills | Status: DC | PRN
  Filled 2023-08-22: qty 15, 4d supply, fill #0

## 2023-08-22 MED ORDER — TAMSULOSIN HCL 0.4 MG PO CAPS
0.4000 mg | ORAL_CAPSULE | Freq: Every day | ORAL | 0 refills | Status: DC
  Filled 2023-08-22: qty 15, 15d supply, fill #0

## 2023-08-22 MED ORDER — OXYCODONE-ACETAMINOPHEN 5-325 MG PO TABS: 1.0000 | ORAL_TABLET | Freq: Four times a day (QID) | ORAL | 0 refills | Status: DC | PRN

## 2023-08-22 MED ORDER — OXYCODONE-ACETAMINOPHEN 5-325 MG PO TABS: 1.0000 | ORAL_TABLET | Freq: Four times a day (QID) | ORAL | 0 refills | Status: AC | PRN

## 2023-08-22 MED ORDER — TAMSULOSIN HCL 0.4 MG PO CAPS: 0.4000 mg | ORAL_CAPSULE | Freq: Every day | ORAL | 0 refills | Status: DC

## 2023-08-22 MED ORDER — KETOROLAC TROMETHAMINE 15 MG/ML IJ SOLN
15.0000 mg | Freq: Once | INTRAMUSCULAR | Status: AC
  Administered 2023-08-22: 15 mg via INTRAVENOUS
  Filled 2023-08-22: qty 1

## 2023-08-22 NOTE — ED Notes (Signed)
 DC paperwork given and verbally understood.

## 2023-08-22 NOTE — ED Notes (Signed)
 Pt aware of the need for a urine... Unable to currently provide a sample.Marland KitchenMarland Kitchen

## 2023-08-22 NOTE — ED Provider Notes (Signed)
 [facility] AT [facility] Provider Note   CSN: [id number] Arrival date & time: 08/22/23  3244     History Chief Complaint  Patient presents with   Back Pain   Urinary Frequency    Rebekah Reynolds is a 63 y.o. female with no contributing past medical history who presents to the emergency department today with urinary urgency and frequency which started last night.  She is also reporting associated left-sided flank pain.  She denies any fever, chills, nausea, vomiting.  Patient denies history of kidney stones.  She does state that she has had similar symptoms in the past when she was in college which ultimately resulted in a kidney infection.   Back Pain Urinary Frequency       Home Medications Prior to Admission medications   Medication Sig Start Date End Date Taking? Authorizing Provider  acetaminophen  (TYLENOL ) 120 MG suppository Place 120 mg rectally every 4 (four) hours as needed.    [provider]  acetaminophen -codeine (TYLENOL  #3) 300-30 MG tablet Take 2 tablets by mouth every 6 (six) hours as needed for moderate pain (pain score 4-6). 07/13/20   [provider]  AIMOVIG 70 MG/ML SOAJ Inject 70 mg into the skin every 30 (thirty) days. Patient not taking: Reported on 06/15/2023 03/01/17   [provider]  busPIRone (BUSPAR) 10 MG tablet Take 10 mg by mouth daily. 06/07/23   [provider]  Butalbital-ASA-Caff-Codeine (ASCOMP-CODEINE PO) Take 1 tablet by mouth every 8 (eight) hours as needed. As needed for severe headache  Patient not taking: Reported on 06/15/2023    [provider]  cephALEXin  (KEFLEX ) 500 MG capsule Take 1 capsule (500 mg total) by mouth 4 (four) times daily. Patient not taking: Reported on 06/15/2023 04/29/21   Lind Repine, MD  Cholecalciferol 1.25 MG (50000 UT) capsule Take 50,000 Units by mouth daily. 04/28/23   [provider]  COVID-19 mRNA Vac-TriS, Pfizer,  (PFIZER-BIONT COVID-19 VAC-TRIS) SUSP injection Inject into the muscle. Patient not taking: Reported on 06/15/2023 09/10/20   Liane Redman, MD  COVID-19 mRNA vaccine [phone] (COMIRNATY ) syringe Inject into the muscle. Patient not taking: Reported on 06/15/2023 03/20/22   Liane Redman, MD  [facility] 120 MG/ML SOAJ once a week. 05/07/23   [provider]  fludrocortisone  (FLORINEF ) 0.1 MG tablet Take 2 tablets (0.2 mg total) by mouth 2 (two) times daily. 06/25/23   Tobb, Kardie, DO  ibuprofen  (ADVIL ,MOTRIN ) 800 MG tablet Take 1 tablet (800 mg total) by mouth every 8 (eight) hours as needed. Patient not taking: Reported on 06/15/2023 05/12/17   Lula Sale, MD  indomethacin (INDOCIN) 25 MG capsule Take 25 mg by mouth daily. 03/30/23   [provider]  magnesium oxide (MAG-OX) 400 MG tablet Take 400 mg by mouth daily. 06/07/23   [provider]  meclizine (ANTIVERT) 25 MG tablet Take 25 mg by mouth daily.    [provider]  meloxicam (MOBIC) 15 MG tablet Take 15 mg by mouth daily. Patient not taking: Reported on 06/15/2023 03/26/17   [provider]  metFORMIN (GLUCOPHAGE-XR) 500 MG 24 hr tablet Take 500 mg by mouth every morning. 03/01/17   [provider]  oxycodone -acetaminophen  (PERCOCET) 2.5-325 MG tablet Take 1 tablet by mouth every 4 (four) hours as needed for pain. 05/12/17   Lula Sale, MD  oxyCODONE -acetaminophen  (PERCOCET/ROXICET) 5-325 MG tablet Take 1 tablet by mouth every 6 (six) hours as needed for severe pain (pain score 7-10). 08/22/23  Jamal Mays, Mykell Genao M, PA-C  promethazine  (PHENERGAN ) 25 MG tablet Take 25 mg by mouth every 6 (six) hours as needed for nausea or vomiting.    [provider]  propranolol (INDERAL) 80 MG tablet Take 80 mg by mouth every morning.     [provider]  Riboflavin 400 MG TABS daily. 06/07/23   [provider]  rosuvastatin (CRESTOR) 20 MG tablet Take 20 mg by mouth at bedtime.     [provider]  Semaglutide, 2 MG/DOSE, (OZEMPIC, 2 MG/DOSE,) 8 MG/3ML SOPN once a week. 04/28/23   [provider]  SUMAtriptan (IMITREX) 100 MG tablet Take 100 mg by mouth every 8 (eight) hours as needed. For migraine     [provider]  SUMAtriptan 6 MG/0.5ML SOAJ as needed.    [provider]  tamsulosin (FLOMAX) 0.4 MG CAPS capsule Take 1 capsule (0.4 mg total) by mouth daily. 08/22/23   Angelyn Kennel M, PA-C  topiramate (TOPAMAX) 50 MG tablet Take 50 mg by mouth 3 (three) times daily.   Patient not taking: Reported on 06/15/2023    [provider]  traMADol (ULTRAM) 50 MG tablet Take 50 mg by mouth every 6 (six) hours as needed for moderate pain.    [provider]  UBRELVY 100 MG TABS as needed. 12/07/18   [provider]  Vitamin D, Ergocalciferol, (DRISDOL) 50000 units CAPS capsule Take 50,000 Units by mouth once a week. Patient not taking: Reported on 06/15/2023 03/01/17   [provider]      Allergies    Patient has no known allergies.    Review of Systems   Review of Systems  Genitourinary:  Positive for frequency.  Musculoskeletal:  Positive for back pain.  All other systems reviewed and are negative.   Physical Exam Updated Vital Signs BP (!) 161/91 (BP Location: Right Arm)   Pulse 86   Temp 97.9 F (36.6 C)   Resp 18   SpO2 96%  Physical Exam Vitals and nursing note reviewed.  Constitutional:      General: She is not in acute distress.    Appearance: Normal appearance.  HENT:     Head: Normocephalic and atraumatic.  Eyes:     General:        Right eye: No discharge.        Left eye: No discharge.  Cardiovascular:     Comments: Regular rate and rhythm.  S1/S2 are distinct without any evidence of murmur, rubs, or gallops.  Radial pulses are 2+ bilaterally.  Dorsalis pedis pulses are 2+ bilaterally.  No evidence of pedal edema. Pulmonary:     Comments: Clear to auscultation bilaterally.   Normal effort.  No respiratory distress.  No evidence of wheezes, rales, or rhonchi heard throughout. Abdominal:     General: Abdomen is flat. Bowel sounds are normal. There is no distension.     Tenderness: There is no abdominal tenderness. There is left CVA tenderness. There is no guarding or rebound.  Musculoskeletal:        General: Normal range of motion.     Cervical back: Neck supple.  Skin:    General: Skin is warm and dry.     Findings: No rash.  Neurological:     General: No focal deficit present.     Mental Status: She is alert.  Psychiatric:        Mood and Affect: Mood normal.        Behavior: Behavior normal.  ED Results / Procedures / Treatments   Labs (all labs ordered are listed, but only abnormal results are displayed) Labs Reviewed  CBC WITH DIFFERENTIAL/PLATELET - Abnormal; Notable for the following components:      Result Value   RBC 3.84 (*)    All other components within normal limits  BASIC METABOLIC PANEL WITH GFR - Abnormal; Notable for the following components:   Glucose, Bld 101 (*)    Creatinine, Ser 1.05 (*)    GFR, Estimated 60 (*)    All other components within normal limits  URINALYSIS, ROUTINE W REFLEX MICROSCOPIC - Abnormal; Notable for the following components:   APPearance HAZY (*)    Hgb urine dipstick SMALL (*)    Bacteria, UA RARE (*)    All other components within normal limits  URINE CULTURE    EKG None  Radiology CT Renal Stone Study Result Date: 08/22/2023 A CLINICAL DATA: 1 flank pain.  Kidney stones suspected. EXAM: CT ABDOMEN AND PELVIS WITHOUT CONTRAST TECHNIQUE: Multidetector CT imaging of the abdomen and pelvis was performed following the standard protocol without IV contrast. RADIATION DOSE REDUCTION: This exam was performed according to the departmental dose-optimization program which includes automated exposure control, adjustment of the mA and/or kV according to patient size and/or use of iterative reconstruction  technique. COMPARISON:  None Available. FINDINGS: Lower chest: No acute findings. Hepatobiliary: No suspicious focal abnormality in the liver on this study without intravenous contrast. There is no evidence for gallstones, gallbladder wall thickening, or pericholecystic fluid. No intrahepatic or extrahepatic biliary dilation. Pancreas: No focal mass lesion. No dilatation of the main duct. No intraparenchymal cyst. No peripancreatic edema. Spleen: No splenomegaly. No suspicious focal mass lesion. Adrenals/Urinary Tract: No adrenal nodule or mass. Right kidney and ureter unremarkable. Mild left hydroureteronephrosis with 1-2 mm left UPJ stone (axial 65/2 and coronal 50/4). No bladder stones. Stomach/Bowel: Stomach is unremarkable. No gastric wall thickening. No evidence of outlet obstruction. Duodenum is normally positioned as is the ligament of Treitz. No small bowel wall thickening. No small bowel dilatation. The terminal ileum is normal. The appendix is normal. No gross colonic mass. No colonic wall thickening. Diverticular changes are noted in the left colon without evidence of diverticulitis. Vascular/Lymphatic: No abdominal aortic aneurysm. No abdominal aortic atherosclerotic calcification. There is no gastrohepatic or hepatoduodenal ligament lymphadenopathy. No retroperitoneal or mesenteric lymphadenopathy. No pelvic sidewall lymphadenopathy. Reproductive: The uterus is unremarkable.  There is no adnexal mass. Other: No intraperitoneal free fluid. Musculoskeletal: No worrisome lytic or sclerotic osseous abnormality. IMPRESSION: 1. 1-2 mm left UPJ stone with mild left hydroureteronephrosis. 2. Left colonic diverticulosis without diverticulitis. Electronically Signed   By: Donnal Fusi M.D.   On: 08/22/2023 11:35    Procedures Procedures    Medications Ordered in ED Medications  ketorolac  (TORADOL ) 15 MG/ML injection 15 mg (15 mg Intravenous Given 08/22/23 1055)  sodium chloride  0.9 % bolus 1,000 mL (0  mLs Intravenous Stopped 08/22/23 1129)    ED Course/ Medical Decision Making/ A&P Clinical Course as of 08/22/23 1205  Sun Aug 22, 2023  1049 I notified her that her urinalysis does not appear to be infected.  There is a good amount of microscopic hematuria.  Given that the patient is having flank pain with hematuria in the absence of infection I will get a CT renal stone study to look for kidney stone.  Patient agreeable with this.  Will also order Toradol  and a bolus of fluids as well. [CF]  1155 I went  over all labs and imaging with her at the bedside.  CT renal stone study did reveal a small 1 to 2 mm stone in the left UPJ which is likely causing her pain and hematuria.  I will send Flomax and a short course of narcotic pain medication to the pharmacy here which she can pick it up.  Patient agreeable with plan. [CF]  1155 CBC with Differential(!) Negative. [CF]  1155 Urinalysis, Routine w reflex microscopic -Urine, Clean Catch(!) No evidence of infection.  There is evidence of hematuria. [CF]  1156 Basic metabolic panel(!) Normal.  Slight bump in her creatinine which is likely due to hydronephrosis. [CF]  1156 CT Renal Stone Study I personally ordered and interpreted the study.  There is evidence of a ureteral stone.  This is likely causing her pain.  I do agree with the radiologist interpretation. [CF]    Clinical Course User Index [CF] Darletta Ehrich, PA-C   {   Click here for ABCD2, HEART and other calculators  Medical Decision Making DENIA MCVICAR is a 63 y.o. female patient who presents to the emergency department today for further evaluation of urinary urgency/frequency and left-sided flank pain.  Will plan to just get basic labs and urinalysis to evaluate for cystitis or pyelonephritis.  Clinically patient does not have pyelonephritis criteria given that she does not have any nausea or vomiting.  Patient's abdomen is nontender and soft.  Will hold off on imaging for right  now.  Given that the patient was not have any urinary infection symptoms to explain her pain we proceeded with a CT renal stone study as highlighted in the ED course.  This was positive for a ureteral stone.  Will treat accordingly with Flomax and narcotic pain medication.  She will be provided a strainer.  Strict return precautions were discussed.  She is safer discharge.  Amount and/or Complexity of Data Reviewed Labs: ordered. Decision-making details documented in ED Course. Radiology: ordered. Decision-making details documented in ED Course.  Risk Prescription drug management.    Final Clinical Impression(s) / ED Diagnoses Final diagnoses:  Ureterolithiasis    Rx / DC Orders ED Discharge Orders          Ordered    oxyCODONE -acetaminophen  (PERCOCET/ROXICET) 5-325 MG tablet  Every 6 hours PRN,   Status:  Discontinued        08/22/23 1159    tamsulosin (FLOMAX) 0.4 MG CAPS capsule  Daily,   Status:  Discontinued        08/22/23 1159    oxyCODONE -acetaminophen  (PERCOCET/ROXICET) 5-325 MG tablet  Every 6 hours PRN        08/22/23 1200    tamsulosin (FLOMAX) 0.4 MG CAPS capsule  Daily        08/22/23 1200              Angelyn [employer], [place] 08/22/23 1206    Hershel Los, MD 08/22/23 1231

## 2023-08-22 NOTE — ED Triage Notes (Signed)
 Co left sided lower back pain with urinary frequency starting last night,

## 2023-08-22 NOTE — Discharge Instructions (Signed)
 As we discussed, you have a stone in the ureter.  This will pass on its own.  Please use a strainer every time you urinate until you pass the stone.  You can take the medications I have prescribed.  Please drink plenty of fluids.  You may return to the emergency department for any worsening symptoms.

## 2023-08-23 ENCOUNTER — Other Ambulatory Visit (HOSPITAL_BASED_OUTPATIENT_CLINIC_OR_DEPARTMENT_OTHER): Payer: Self-pay

## 2023-08-23 LAB — URINE CULTURE

## 2023-09-28 ENCOUNTER — Encounter (INDEPENDENT_AMBULATORY_CARE_PROVIDER_SITE_OTHER): Payer: Self-pay

## 2023-09-28 ENCOUNTER — Encounter: Payer: Self-pay | Admitting: Cardiology

## 2023-09-28 ENCOUNTER — Ambulatory Visit: Attending: Cardiology | Admitting: Cardiology

## 2023-09-28 VITALS — BP 116/70 | HR 69 | Ht 62.0 in | Wt 196.0 lb

## 2023-09-28 DIAGNOSIS — Z794 Long term (current) use of insulin: Secondary | ICD-10-CM

## 2023-09-28 DIAGNOSIS — I10 Essential (primary) hypertension: Secondary | ICD-10-CM | POA: Diagnosis not present

## 2023-09-28 DIAGNOSIS — R42 Dizziness and giddiness: Secondary | ICD-10-CM

## 2023-09-28 DIAGNOSIS — E119 Type 2 diabetes mellitus without complications: Secondary | ICD-10-CM | POA: Diagnosis not present

## 2023-09-28 DIAGNOSIS — Z87898 Personal history of other specified conditions: Secondary | ICD-10-CM

## 2023-09-28 DIAGNOSIS — E782 Mixed hyperlipidemia: Secondary | ICD-10-CM

## 2023-09-28 NOTE — Patient Instructions (Signed)
 Medication Instructions:  Your physician recommends that you continue on your current medications as directed. Please refer to the Current Medication list given to you today.  *If you need a refill on your cardiac medications before your next appointment, please call your pharmacy*   Follow-Up: At [facility], you and your health needs are our priority.  As part of our continuing mission to provide you with exceptional heart care, our providers are all part of one team.  This team includes your primary Cardiologist (physician) and Advanced Practice Providers or APPs (Physician Assistants and Nurse Practitioners) who all work together to provide you with the care you need, when you need it.  Your next appointment:   6 month(s)  Provider:   Kardie Tobb, DO     Other Instructions Referral for ENT and EP placed. Please make appointments.

## 2023-09-28 NOTE — Progress Notes (Signed)
 Cardiology Office Note:    Date:  09/28/2023   ID:  Rebekah Reynolds, DOB May 30, 1960, MRN [id number]  PCP:  Patient, No Pcp Per  Cardiologist:  Dub Huntsman, DO  Electrophysiologist:  None   Referring MD: No ref. provider found    I am having dizziness  History of Present Illness:    Rebekah Reynolds is a 63 y.o. female with a hx of complicated migraine, Diabetes Mellitus , Primary hypertension, Mixed hyperlipidemia.   At her initial visit with me she presented to establish cardiac care in [place] .  She had been following with cardiology back in [place] .  During that visit she had had a syncope episode I restarted her Florinef  which was twice a day.  We talked about her elevated blood pressure she had been on propranolol for migraine headaches.  We ordered testing as well as coronary calcium score.  In the interim she got her coronary calcium score which had a total calcium of 4.9.  She is here today for visit she tells me that about a month ago she experienced a syncope episode.  She reports that she was standing or doing her laundry she started to have some ringing in the ears and feels like her ears were stopped up she felt lightheaded and dizzy by the time she could find a place to sit down she had passed out.  She is not sure how long she was down for.  In the meantime she also shared that she has experienced recently bronchitis and kidney stone which she has been treated for.  She reported that she also was recently approved for her disability and tells me that they may be reaching out to me.  She note that her diagnoses for the disability is her syncope  Past Medical History:  Diagnosis Date   Arthritis    bilatera knees   Migraine headache    chronic migrains   Pre-diabetes     Past Surgical History:  Procedure Laterality Date   BREAST SURGERY  1982   benign right breast cyst removed   DILATATION & CURRETTAGE/HYSTEROSCOPY WITH RESECTOCOPE N/A 05/12/2017    Procedure: DILATATION & CURETTAGE/HYSTEROSCOPY WITH RESECTOCOPE;  Surgeon: Manda Fess, MD;  Location: [facility];  Service: Gynecology;  Laterality: N/A;    Current Medications: Current Meds  Medication Sig   acetaminophen -codeine (TYLENOL  #3) 300-30 MG tablet Take 2 tablets by mouth every 6 (six) hours as needed for moderate pain (pain score 4-6).   busPIRone (BUSPAR) 10 MG tablet Take 10 mg by mouth daily.   Butalbital-ASA-Caff-Codeine (ASCOMP-CODEINE PO) Take 1 tablet by mouth every 8 (eight) hours as needed. As needed for severe headache    Cholecalciferol 1.25 MG (50000 UT) capsule Take 50,000 Units by mouth daily.   doxycycline (VIBRAMYCIN) 100 MG capsule Take 100 mg by mouth 2 (two) times daily.   EMGALITY 120 MG/ML SOAJ once a week.   fludrocortisone  (FLORINEF ) 0.1 MG tablet Take 2 tablets (0.2 mg total) by mouth 2 (two) times daily.   HYDROcodone bit-homatropine (HYCODAN) 5-1.5 MG/5ML syrup Take 5 mLs by mouth every 6 (six) hours as needed.   ibuprofen  (ADVIL ,MOTRIN ) 800 MG tablet Take 1 tablet (800 mg total) by mouth every 8 (eight) hours as needed.   indomethacin (INDOCIN) 25 MG capsule Take 25 mg by mouth daily.   magnesium oxide (MAG-OX) 400 MG tablet Take 400 mg by mouth daily.   meclizine (ANTIVERT) 25 MG tablet Take 25 mg by mouth daily.  oxycodone -acetaminophen  (PERCOCET) 2.5-325 MG tablet Take 1 tablet by mouth every 4 (four) hours as needed for pain.   oxyCODONE -acetaminophen  (PERCOCET/ROXICET) 5-325 MG tablet Take 1 tablet by mouth every 6 (six) hours as needed for severe pain (pain score 7-10).   promethazine  (PHENERGAN ) 25 MG tablet Take 25 mg by mouth every 6 (six) hours as needed for nausea or vomiting.   propranolol (INDERAL) 80 MG tablet Take 80 mg by mouth every morning.    Riboflavin 400 MG TABS daily.   rosuvastatin (CRESTOR) 20 MG tablet Take 20 mg by mouth at bedtime.   SUMAtriptan (IMITREX) 100 MG tablet Take 100 mg by mouth every 8 (eight) hours as  needed. For migraine    SUMAtriptan 6 MG/0.5ML SOAJ as needed.   traMADol (ULTRAM) 50 MG tablet Take 50 mg by mouth every 6 (six) hours as needed for moderate pain.   UBRELVY 100 MG TABS as needed.   Vitamin D, Ergocalciferol, (DRISDOL) 50000 units CAPS capsule Take 50,000 Units by mouth once a week.     Allergies:   Patient has no known allergies.   Social History   Socioeconomic History   Marital status: Single    Spouse name: Not on file   Number of children: Not on file   Years of education: Not on file   Highest education level: Not on file  Occupational History   Not on file  Tobacco Use   Smoking status: Never   Smokeless tobacco: Never  Vaping Use   Vaping status: Never Used  Substance and Sexual Activity   Alcohol use: Yes    Comment: occasional   Drug use: No   Sexual activity: Not on file  Other Topics Concern   Not on file  Social History Narrative   Not on file   Social Drivers of Health   Financial Resource Strain: Not on file  Food Insecurity: Not on file  Transportation Needs: Not on file  Physical Activity: Not on file  Stress: Not on file  Social Connections: Not on file     Family History: The patient's family history is not on file.  ROS:   Review of Systems  Constitution: Negative for decreased appetite, fever and weight gain.  HENT: Negative for congestion, ear discharge, hoarse voice and sore throat.   Eyes: Negative for discharge, redness, vision loss in right eye and visual halos.  Cardiovascular: Negative for chest pain, dyspnea on exertion, leg swelling, orthopnea and palpitations.  Respiratory: Negative for cough, hemoptysis, shortness of breath and snoring.   Endocrine: Negative for heat intolerance and polyphagia.  Hematologic/Lymphatic: Negative for bleeding problem. Does not bruise/bleed easily.  Skin: Negative for flushing, nail changes, rash and suspicious lesions.  Musculoskeletal: Negative for arthritis, joint pain, muscle  cramps, myalgias, neck pain and stiffness.  Gastrointestinal: Negative for abdominal pain, bowel incontinence, diarrhea and excessive appetite.  Genitourinary: Negative for decreased libido, genital sores and incomplete emptying.  Neurological: Negative for brief paralysis, focal weakness, headaches and loss of balance.  Psychiatric/Behavioral: Negative for altered mental status, depression and suicidal ideas.  Allergic/Immunologic: Negative for HIV exposure and persistent infections.    EKGs/Labs/Other Studies Reviewed:    The following studies were reviewed today:   EKG:  The ekg ordered today demonstrates   Echocardiogram 04/28/22  Left Ventricle: Normal LV systolic function, estimated EF 55 - 60%. Global longitudinal strain is normal with a value of -18.9%. Normal LV wall motion. Left ventricle size is normal. Normal LV wall thickness. Normal  diastolic function. Tissue Doppler velocity is reduced. MV e' septal velocity is 5.6 cm/s.  Right Ventricle: Right ventricle size is normal. Normal systolic function. TAPSE is 2.2 cm.  Aortic Valve: Mild aortic regurgitation with a centrally directed jet.  Tricuspid Valve: Mild tricuspid regurgitation with a centrally directed jet. TR peak velocity is 2.5 m/s. Pulmonary artery pressure could not be estimated.  Aorta: Normal sized sinus of Valsalva. Mildly enlarged ascending aorta. Ascending Aorta measures 3.26 cm with a normal index of 1.8.  11/11/20 Normal left ventricular size and wall thickness. No regional wall motion  abnormality is seen. Left ventricular function is normal. LVEF visually  estimated at 55-69%. Diastolic filling pattern is normal. Global  longitudinal strain is normal (Avg. = - 22.4 %).  Normal right ventricular size and function.  Normal left atrium by LA volume-index. There is no evidence of PFO by 2D  exam & agitated saline.  Normal right atrial size.    02/06/22   Indication for study was syncope  Patient was  monitored for 13 days and 17 hours  The predominant underlying rhythm was sinus, long sinus minimum heart rate 52 bpm, average 83 bpm, max 168 bpm  There were rare PACs in isolation  There were rare PVCs in isolation  There was no VT or A-fib  There were 9 triggered events that all occurred during sinus rhythm or periods of artifact   Recent Labs: 06/15/2023: ALT 11; Magnesium 1.9 08/22/2023: BUN 14; Creatinine, Ser 1.05; Hemoglobin 12.1; Platelets 355; Potassium 4.1; Sodium 140  Recent Lipid Panel No results found for: CHOL, TRIG, HDL, CHOLHDL, VLDL, LDLCALC, LDLDIRECT  Physical Exam:    VS:  BP 116/70 (BP Location: Left Arm, Patient Position: Sitting, Cuff Size: Large)   Pulse 69   Ht 5' 2 (1.575 m)   Wt 196 lb (88.9 kg)   SpO2 90%   BMI 35.85 kg/m     Wt Readings from Last 3 Encounters:  09/28/23 196 lb (88.9 kg)  06/15/23 186 lb 3.2 oz (84.5 kg)  04/29/21 229 lb 0.9 oz (103.9 kg)     GEN: Well nourished, well developed in no acute distress HEENT: Normal NECK: No JVD; No carotid bruits LYMPHATICS: No lymphadenopathy CARDIAC: S1S2 noted,RRR, no murmurs, rubs, gallops RESPIRATORY:  Clear to auscultation without rales, wheezing or rhonchi  ABDOMEN: Soft, non-tender, non-distended, +bowel sounds, no guarding. EXTREMITIES: No edema, No cyanosis, no clubbing MUSCULOSKELETAL:  No deformity  SKIN: Warm and dry NEUROLOGIC:  Alert and oriented x 3, non-focal PSYCHIATRIC:  Normal affect, good insight  ASSESSMENT:    1. History of syncope   2. Primary hypertension   3. Insulin-requiring or dependent type II diabetes mellitus (HCC)   4. Mixed hyperlipidemia   5. Dizziness     PLAN:    Syncope We restarted her on Florinef  during her last visit.  She tells me she had a syncope episode recently.  What is not clear in terms of cardiac event for this episode is the fact that she was experiencing ringing in the ears right prior to these event.  So I am going to send  the patient to ENT to understand if there is any inner ear disequilibrium here. In the meantime for her recurrent episodes she has never had a loop recorder placed.  I am going to refer her to EP for evaluation for implantable loop recorder device.  I shared with the patient about this as well.  I did discuss the [facility] DMV medical  guidelines for driving: it is prudent to recommend that all persons should be free of syncopal episodes for at least six months to be granted the driving privilege. (THE [place]  PHYSICIAN'S GUIDE TO DRIVER MEDICAL EVALUATION, Second Edition, Medical Review Branch, Associate Professor, Division of [employer], Office manager of Transportation, July 2004)  Hypertension Blood pressure is at target  Diabetes Mellitus Recent hemoglobin A1c 5.3%.  She is not on any medication at this time.  Diet controlled  Hyperlipidemia Continue her rosuvastatin.  Triglycerides on her most recent lab was 230 I discussed with the patient ideally this should be less than 150.  Will repeat labs at her next visit if this is still elevated we discussed use of medications.  The patient understands the need to lose weight with diet and exercise. We have discussed specific strategies for this.  The patient is in agreement with the above plan. The patient left the office in stable condition.  The patient will follow up in   Medication Adjustments/Labs and Tests Ordered: Current medicines are reviewed at length with the patient today.  Concerns regarding medicines are outlined above.  Orders Placed This Encounter  Procedures   Ambulatory referral to ENT   Ambulatory referral to Cardiac Electrophysiology   No orders of the defined types were placed in this encounter.   Patient Instructions  Medication Instructions:  Your physician recommends that you continue on your current medications as directed. Please refer to the Current Medication list given to you today.   *If you need a refill on your cardiac medications before your next appointment, please call your pharmacy*   Follow-Up: At [facility], you and your health needs are our priority.  As part of our continuing mission to provide you with exceptional heart care, our providers are all part of one team.  This team includes your primary Cardiologist (physician) and Advanced Practice Providers or APPs (Physician Assistants and Nurse Practitioners) who all work together to provide you with the care you need, when you need it.  Your next appointment:   6 month(s)  Provider:   Rockey Guarino, DO     Other Instructions Referral for ENT and EP placed. Please make appointments.   Adopting a Healthy Lifestyle.  Know what a healthy weight is for you (roughly BMI <25) and aim to maintain this   Aim for 7+ servings of fruits and vegetables daily   65-80+ fluid ounces of water or unsweet tea for healthy kidneys   Limit to max 1 drink of alcohol per day; avoid smoking/tobacco   Limit animal fats in diet for cholesterol and heart health - choose grass fed whenever available   Avoid highly processed foods, and foods high in saturated/trans fats   Aim for low stress - take time to unwind and care for your mental health   Aim for 150 min of moderate intensity exercise weekly for heart health, and weights twice weekly for bone health   Aim for 7-9 hours of sleep daily   When it comes to diets, agreement about the perfect plan isnt easy to find, even among the experts. Experts at the [facility] [employer] developed an idea known as the Healthy Eating Plate. Just imagine a plate divided into logical, healthy portions.   The emphasis is on diet quality:   Load up on vegetables and fruits - one-half of your plate: Aim for color and variety, and remember that potatoes dont count.   Go for  whole grains - one-quarter of your plate: Whole wheat, barley, wheat berries, quinoa, oats,  brown rice, and foods made with them. If you want pasta, go with whole wheat pasta.   Protein power - one-quarter of your plate: Fish, chicken, beans, and nuts are all healthy, versatile protein sources. Limit red meat.   The diet, however, does go beyond the plate, offering a few other suggestions.   Use healthy plant oils, such as olive, canola, soy, corn, sunflower and peanut. Check the labels, and avoid partially hydrogenated oil, which have unhealthy trans fats.   If youre thirsty, drink water. Coffee and tea are good in moderation, but skip sugary drinks and limit milk and dairy products to one or two daily servings.   The type of carbohydrate in the diet is more important than the amount. Some sources of carbohydrates, such as vegetables, fruits, whole grains, and beans-are healthier than others.   Finally, stay active  Signed, Dub Huntsman, DO  09/28/2023 7:43 PM    [facility]

## 2023-11-30 ENCOUNTER — Ambulatory Visit: Admitting: Cardiology

## 2023-12-13 NOTE — Progress Notes (Unsigned)
  Electrophysiology Office Note:   Date:  12/16/2023  ID:  TEAH VOTAW, DOB 09-Jul-1960, MRN [id number]  Primary Cardiologist: Kardie Tobb, DO Primary Heart Failure: None Electrophysiologist: Kingston Guiles Gladis Norton, MD      History of Present Illness:   Rebekah Reynolds is a 63 y.o. female with h/o migraines, diabetes, hypertension, hyperlipidemia seen today for  for Electrophysiology evaluation of syncope at the request of Kardie Tobb.     She initially saw cardiology March 2025.  She had moved from [place] .  She follow-up with cardiology there.  She had an episode of syncope.  She was restarted on Florinef .  She had another episode of syncope June 2025.  She was standing and doing laundry.  She had ringing in the ears and felt like her ears were stopped up.  She felt lightheaded dizzy.  By the time she found a place to sit down she had lost consciousness.  She has had multiple episodes of syncope.  She has had syncopal episodes since she was 16.  She feels hot, sweaty and cold when she has the episodes.  She does not get nauseous.  When she wakes up, she feels like she is in a pool of sweat.  She has found no obvious trigger for these episodes.  She does not have chest pain or shortness of breath.  Review of systems complete and found to be negative unless listed in HPI.   EP Information / Studies Reviewed:    EKG is not ordered today. EKG from 06/15/2023 reviewed which showed sinus rhythm        Risk Assessment/Calculations:            Physical Exam:   VS:  BP (!) 144/80 (BP Location: Right Arm, Patient Position: Sitting, Cuff Size: Large)   Pulse 68   Ht 5' 2 (1.575 m)   Wt 209 lb (94.8 kg)   SpO2 98%   BMI 38.23 kg/m    Wt Readings from Last 3 Encounters:  12/16/23 209 lb (94.8 kg)  09/28/23 196 lb (88.9 kg)  06/15/23 186 lb 3.2 oz (84.5 kg)     GEN: Well nourished, well developed in no acute distress NECK: No JVD; No carotid bruits CARDIAC: Regular rate and  rhythm, no murmurs, rubs, gallops RESPIRATORY:  Clear to auscultation without rales, wheezing or rhonchi  ABDOMEN: Soft, non-tender, non-distended EXTREMITIES:  No edema; No deformity   ASSESSMENT AND PLAN:    1.  Syncope: Florinef  has been restarted.  She had another episode of syncope with standing.  She has been referred to ENT.  Her episodes of syncope sound more vasovagal than orthostatic.  She has not had any arrhythmias that have been noted.  She did have a tilt table test in [place]  which showed no initial changes in heart rate or blood pressure.  She was given nitroglycerin with a drop in blood pressure at 5 minutes but no syncope.  She apparently had an appropriate increase in her heart rate and no pauses.  We Lisbet Busker have her wear a 2-week monitor.  If monitor is unrevealing, she may need to simply avoid vagal triggers.  I did discuss with her maneuvers to hopefully prevent syncopal episodes.  2.  Hypertension: Well-controlled  3.  Hyperlipidemia: Continue Crestor    Follow up with Dr. Norton post monitor   Signed, Avaline Stillson Gladis Norton, MD

## 2023-12-16 ENCOUNTER — Encounter (HOSPITAL_BASED_OUTPATIENT_CLINIC_OR_DEPARTMENT_OTHER): Payer: Self-pay | Admitting: Pulmonary Disease

## 2023-12-16 ENCOUNTER — Encounter: Payer: Self-pay | Admitting: Cardiology

## 2023-12-16 ENCOUNTER — Ambulatory Visit: Attending: Cardiology | Admitting: Cardiology

## 2023-12-16 ENCOUNTER — Ambulatory Visit

## 2023-12-16 ENCOUNTER — Ambulatory Visit (INDEPENDENT_AMBULATORY_CARE_PROVIDER_SITE_OTHER): Admitting: Pulmonary Disease

## 2023-12-16 VITALS — BP 144/80 | HR 68 | Ht 62.0 in | Wt 209.0 lb

## 2023-12-16 VITALS — BP 133/86 | HR 90 | Ht 62.0 in | Wt 214.0 lb

## 2023-12-16 DIAGNOSIS — G4733 Obstructive sleep apnea (adult) (pediatric): Secondary | ICD-10-CM | POA: Diagnosis not present

## 2023-12-16 DIAGNOSIS — Z87898 Personal history of other specified conditions: Secondary | ICD-10-CM

## 2023-12-16 DIAGNOSIS — J4530 Mild persistent asthma, uncomplicated: Secondary | ICD-10-CM | POA: Diagnosis not present

## 2023-12-16 DIAGNOSIS — I10 Essential (primary) hypertension: Secondary | ICD-10-CM

## 2023-12-16 NOTE — Progress Notes (Unsigned)
 Enrolled patient for a 14 day Zio XT  monitor to be mailed to patients home

## 2023-12-16 NOTE — Patient Instructions (Signed)
 Medication Instructions:  Your physician recommends that you continue on your current medications as directed. Please refer to the Current Medication list given to you today.  *If you need a refill on your cardiac medications before your next appointment, please call your pharmacy*  Testing/Procedures: Event Monitor  Your physician has recommended that you wear an event monitor. Event monitors are medical devices that record the heart's electrical activity. Doctors most often us  these monitors to diagnose arrhythmias. Arrhythmias are problems with the speed or rhythm of the heartbeat. The monitor is a small, portable device. You can wear one while you do your normal daily activities. This is usually used to diagnose what is causing palpitations/syncope (passing out).  Follow-Up: At [facility], you and your health needs are our priority.  As part of our continuing mission to provide you with exceptional heart care, our providers are all part of one team.  This team includes your primary Cardiologist (physician) and Advanced Practice Providers or APPs (Physician Assistants and Nurse Practitioners) who all work together to provide you with the care you need, when you need it.  Your next appointment:   6-8 weeks  Provider:   You may see Will Gladis Norton, MD or one of the following Advanced Practice Providers on your designated Care Team:   Charlies Arthur, [place] Ozell Jodie Passey, PA-C Suzann Riddle, NP Daphne Barrack, NP Artist Pouch, PA-C     ZIO AT Long term monitor-Live Telemetry  Your physician has requested you wear a ZIO patch monitor for 14 days.  This is a single patch monitor. [employer] supplies one patch monitor per enrollment. Additional  stickers are not available.  Please do not apply patch if you will be having a Nuclear Stress Test, Echocardiogram, Cardiac CT, MRI,  or Chest Xray during the period you would be wearing the monitor. The patch cannot be worn during   these tests. You cannot remove and re-apply the ZIO AT patch monitor.  Your ZIO patch monitor will be mailed 3 day USPS to your address on file. It may take 3-5 days to  receive your monitor after you have been enrolled.  Once you have received your monitor, please review the enclosed instructions. Your monitor has  already been registered assigning a specific monitor serial # to you.   Billing and Patient Assistance Program information  Meredeth has been supplied with any insurance information on record for billing. [employer] offers a sliding scale Patient Assistance Program for patients without insurance, or whose  insurance does not completely cover the cost of the ZIO patch monitor. You must apply for the  Patient Assistance Program to qualify for the discounted rate. To apply, call [employer] at [phone],  select option 4, select option 2 , ask to apply for the Patient Assistance Program, (you can request an  interpreter if needed). [employer] will ask your household income and how many people are in your  household. [employer] will quote your out-of-pocket cost based on this information. They will also be able  to set up a 12 month interest free payment plan if needed.  Applying the monitor   Shave hair from upper left chest.  Hold the abrader disc by orange tab. Rub the abrader in 40 strokes over left upper chest as indicated in  your monitor instructions.  Clean area with 4 enclosed alcohol pads. Use all pads to ensure the area is cleaned thoroughly. Let  dry.  Apply patch as indicated in monitor instructions. Patch will be  placed under collarbone on left side of  chest with arrow pointing upward.  Rub patch adhesive wings for 2 minutes. Remove the white label marked 1. Remove the white label  marked 2. Rub patch adhesive wings for 2 additional minutes.  While looking in a mirror, press and release button in center of patch. A small green light will flash 3-4  times. This will be  your only indicator that the monitor has been turned on.  Do not shower for the first 24 hours. You may shower after the first 24 hours.  Press the button if you feel a symptom. You will hear a small click. Record Date, Time and Symptom in  the Patient Log.   Starting the Gateway  In your kit there is a Audiological scientist box the size of a cellphone. This is Buyer, retail. It transmits all your  recorded data to [facility]. This box must always stay within 10 feet of you. Open the box and push the *  button. There will be a light that blinks orange and then green a few times. When the light stops  blinking, the Gateway is connected to the ZIO patch. Call [employer] at [phone] to confirm your monitor is transmitting.  Returning your monitor  Remove your patch and place it inside the Gateway. In the lower half of the Gateway there is a white  bag with prepaid postage on it. Place Gateway in bag and seal. Mail package back to [place] as soon as  possible. Your physician should have your final report approximately 7 days after you have mailed back  your monitor. Call [facility] Customer Care at [phone] if you have questions regarding your ZIO AT  patch monitor. Call them immediately if you see an orange light blinking on your monitor.  If your monitor falls off in less than 4 days, contact our Monitor department at [phone]. If your  monitor becomes loose or falls off after 4 days call [employer] at [phone] for suggestions on  securing your monitor

## 2023-12-16 NOTE — Progress Notes (Signed)
 Subjective:    Patient ID: Rebekah Reynolds, female    DOB: 30-May-1960, 63 y.o.   MRN: [id number]  Discussed the use of AI scribe software for clinical note transcription with the patient, who gave verbal consent to proceed.  History of Present Illness Rebekah Reynolds is a 63 year old female with obstructive sleep apnea who presents with a persistent cough. She was referred for evaluation of a persistent cough.  She experiences a persistent dry cough that occurs intermittently. She had an episode of bronchitis in June, treated by her primary care physician. She frequently had bronchitis before moving to [place] , where she lived for 16-17 years without respiratory issues. Since returning home last year, she has experienced a recurrence of bronchitis. There are no current symptoms of bronchitis, but there is concern about recurrence. She has no asthma or allergies.  She has obstructive sleep apnea, previously managed with a CPAP machine, which was recalled. She hears herself snore and breathe heavily, affecting her sleep quality. She wakes up multiple times during the night and feels miserable due to weight gain and its impact on her breathing and sleep. She lost 52 pounds on weight loss medication but regained 35 pounds, attributed to steroid use for chronic migraines.  She has a history of diabetes and chronic migraines, managed with medications including Inderal. She was previously on Ozempic for weight management. She has used inhalers in the past but does not recall the specific type. An inhaler was given during her bronchitis treatment in June, but she does not remember the name. A sleep study in 2020 showed she stopped breathing 24-25 times per hour.      Significant tests/ events reviewed  CT cors 06/2023 >> clear lungs HST 05/2018 AHI 25/h, low sat 82% 08/2023 AEC 100   Review of Systems  neg for any significant sore throat, dysphagia, itching, sneezing, nasal congestion or  excess/ purulent secretions, fever, chills, sweats, unintended wt loss, pleuritic or exertional cp, hempoptysis, orthopnea pnd or change in chronic leg swelling. Also denies presyncope, palpitations, heartburn, abdominal pain, nausea, vomiting, diarrhea or change in bowel or urinary habits, dysuria,hematuria, rash, arthralgias, visual complaints, headache, numbness weakness or ataxia.      Objective:   Physical Exam  Gen. Pleasant, obese, in no distress ENT - no lesions, no post nasal drip Neck: No JVD, no thyromegaly, no carotid bruits Lungs: no use of accessory muscles, no dullness to percussion, decreased without rales or rhonchi  Cardiovascular: Rhythm regular, heart sounds  normal, no murmurs or gallops, no peripheral edema Musculoskeletal: No deformities, no cyanosis or clubbing , no tremors       Assessment & Plan:   Assessment and Plan Assessment & Plan Obstructive sleep apnea Obstructive sleep apnea diagnosed in 2020 with an apnea-hypopnea index of 24-25 events per hour, exacerbated by recent weight gain. Previous CPAP use discontinued due to recall and lack of follow-up. Symptoms include snoring and daytime fatigue. Insurance coverage for weight loss medication contingent on sleep apnea diagnosis. - Order home sleep test to reassess current severity of sleep apnea - Document diagnosis of sleep apnea to assist with insurance approval for weight loss medication - Consider CPAP therapy if sleep apnea severity warrants it - Use auto settings on CPAP machine for initial trial at home  Obesity Obesity with recent weight gain of 35 pounds, contributing to worsening sleep apnea and breathing difficulties. Previous weight loss of 52 pounds achieved with medication. Insurance coverage for weight loss  medication contingent on sleep apnea diagnosis. - Document sleep apnea diagnosis to support insurance approval for weight loss medication - Coordinate with primary care for potential  prescription of Zepbound for weight loss  Persistent cough and possible asthma under evaluation Intermittent dry cough with a history of bronchitis, possibly related to environmental factors after returning to [place] . Possible allergen exposure in [place] . Differential includes asthma or a viral infection. Wheezing noted, but no definitive diagnosis of asthma yet. - Order [place]  allergy panel/ RAST - Perform pulmonary function test to evaluate for asthma - Use albuterol inhaler as needed for wheezing - Review inhaler type via MyChart and provide if not available

## 2023-12-16 NOTE — Patient Instructions (Addendum)
 X blood work today X Home sleep test  X spirometry pre/post     VISIT SUMMARY: Today, we discussed your persistent cough, obstructive sleep apnea, and recent weight gain. We also reviewed your history of bronchitis and the impact of your weight on your breathing and sleep quality. A plan was made to address these issues and improve your overall health.  YOUR PLAN: -OBSTRUCTIVE SLEEP APNEA: Obstructive sleep apnea is a condition where your breathing stops and starts repeatedly during sleep. We will order a home sleep test to reassess the severity of your sleep apnea. Based on the results, we may consider restarting CPAP therapy. We will also document your sleep apnea diagnosis to help with insurance approval for weight loss medication.  -OBESITY: Obesity is a condition characterized by excessive body weight. Your recent weight gain has worsened your sleep apnea and breathing difficulties. We will document your sleep apnea diagnosis to support insurance approval for weight loss medication and coordinate with your primary care doctor for a potential prescription of Zepbound for weight loss.  -PERSISTENT COUGH AND POSSIBLE ASTHMA: Your persistent dry cough may be related to environmental factors or allergens in [place] . We will order an allergy panel and perform a pulmonary function test to evaluate for asthma. In the meantime, you can use an albuterol inhaler as needed for wheezing. We will also review the type of inhaler you were given previously and provide it if necessary.  INSTRUCTIONS: Please complete the home sleep test as soon as possible. We will follow up with the results to determine the next steps for your sleep apnea treatment. Additionally, schedule the allergy panel and pulmonary function test to evaluate your persistent cough and possible asthma. Use the albuterol inhaler as needed for wheezing, and keep track of any symptoms you experience. We will coordinate with your primary  care doctor regarding the potential prescription of Zepbound for weight loss.                      Contains text generated by [employer].                                 Contains text generated by [employer]bridge.

## 2023-12-17 NOTE — Telephone Encounter (Signed)
 FYI

## 2023-12-19 LAB — ALLERGENS W/TOTAL IGE AREA 2

## 2023-12-20 ENCOUNTER — Ambulatory Visit: Payer: Self-pay | Admitting: Pulmonary Disease

## 2023-12-20 ENCOUNTER — Encounter (HOSPITAL_BASED_OUTPATIENT_CLINIC_OR_DEPARTMENT_OTHER): Payer: Self-pay | Admitting: Pulmonary Disease

## 2023-12-20 NOTE — Telephone Encounter (Signed)
 Is this something were working on for pt?

## 2024-01-01 ENCOUNTER — Encounter

## 2024-01-01 DIAGNOSIS — G4733 Obstructive sleep apnea (adult) (pediatric): Secondary | ICD-10-CM

## 2024-01-17 ENCOUNTER — Encounter (HOSPITAL_BASED_OUTPATIENT_CLINIC_OR_DEPARTMENT_OTHER): Payer: Self-pay | Admitting: Pulmonary Disease

## 2024-01-17 ENCOUNTER — Telehealth: Payer: Self-pay | Admitting: Pulmonary Disease

## 2024-01-17 DIAGNOSIS — R069 Unspecified abnormalities of breathing: Secondary | ICD-10-CM | POA: Diagnosis not present

## 2024-01-17 DIAGNOSIS — G4733 Obstructive sleep apnea (adult) (pediatric): Secondary | ICD-10-CM

## 2024-01-17 NOTE — Telephone Encounter (Signed)
 HST showed mild  OSA with AHI 12/ hr & low sat of 76% Suggest autoCPAP  5-15 cm, mask of choice OV with me/APP in 6 wks

## 2024-01-19 NOTE — Telephone Encounter (Signed)
 Pt notified VIA Mychart as she was asking from there

## 2024-01-24 ENCOUNTER — Institutional Professional Consult (permissible substitution) (INDEPENDENT_AMBULATORY_CARE_PROVIDER_SITE_OTHER): Admitting: Otolaryngology

## 2024-01-25 ENCOUNTER — Encounter (HOSPITAL_BASED_OUTPATIENT_CLINIC_OR_DEPARTMENT_OTHER): Payer: Self-pay

## 2024-01-25 ENCOUNTER — Ambulatory Visit (INDEPENDENT_AMBULATORY_CARE_PROVIDER_SITE_OTHER)

## 2024-01-25 ENCOUNTER — Ambulatory Visit (HOSPITAL_BASED_OUTPATIENT_CLINIC_OR_DEPARTMENT_OTHER)

## 2024-01-25 ENCOUNTER — Other Ambulatory Visit (HOSPITAL_BASED_OUTPATIENT_CLINIC_OR_DEPARTMENT_OTHER): Payer: Self-pay

## 2024-01-25 VITALS — BP 166/77 | HR 82 | Ht 62.0 in | Wt 215.0 lb

## 2024-01-25 DIAGNOSIS — G4733 Obstructive sleep apnea (adult) (pediatric): Secondary | ICD-10-CM | POA: Diagnosis not present

## 2024-01-25 DIAGNOSIS — R0602 Shortness of breath: Secondary | ICD-10-CM | POA: Diagnosis not present

## 2024-01-25 DIAGNOSIS — J42 Unspecified chronic bronchitis: Secondary | ICD-10-CM | POA: Diagnosis not present

## 2024-01-25 DIAGNOSIS — J4530 Mild persistent asthma, uncomplicated: Secondary | ICD-10-CM | POA: Diagnosis not present

## 2024-01-25 LAB — PULMONARY FUNCTION TEST
FEF 25-75 Post: 3.06 L/s
FEF 25-75 Pre: 2.96 L/s
FEF2575-%Change-Post: 3 %
FEF2575-%Pred-Post: 145 %
FEF2575-%Pred-Pre: 140 %
FEV1-%Change-Post: 0 %
FEV1-%Pred-Post: 90 %
FEV1-%Pred-Pre: 89 %
FEV1-Post: 2.06 L
FEV1-Pre: 2.05 L
FEV1FVC-%Change-Post: 3 %
FEV1FVC-%Pred-Pre: 114 %
FEV6-%Change-Post: -2 %
FEV6-%Pred-Post: 78 %
FEV6-%Pred-Pre: 81 %
FEV6-Post: 2.26 L
FEV6-Pre: 2.32 L
FEV6FVC-%Pred-Post: 104 %
FEV6FVC-%Pred-Pre: 104 %
FVC-%Change-Post: -2 %
FVC-%Pred-Post: 75 %
FVC-%Pred-Pre: 77 %
FVC-Post: 2.26 L
FVC-Pre: 2.32 L
Post FEV1/FVC ratio: 91 %
Post FEV6/FVC ratio: 100 %
Pre FEV1/FVC ratio: 88 %
Pre FEV6/FVC Ratio: 100 %

## 2024-01-25 MED ORDER — ALBUTEROL SULFATE HFA 108 (90 BASE) MCG/ACT IN AERS: 2.0000 | INHALATION_SPRAY | Freq: Four times a day (QID) | RESPIRATORY_TRACT | 6 refills | Status: AC | PRN

## 2024-01-25 MED ORDER — AIRSUPRA 90-80 MCG/ACT IN AERO: 2.0000 | INHALATION_SPRAY | Freq: Four times a day (QID) | RESPIRATORY_TRACT | 2 refills | Status: AC | PRN

## 2024-01-25 NOTE — Patient Instructions (Signed)
 Start CPAP; wear nightly for at least 4-6 hours.  Follow up in 31-90 days for compliance download.  Start Albuterol 2 puffs inhaled every 4-6 hours as needed for shortness of breath.  Use Airsupra inhaler instead of Albuterol if actively sick with upper respiratory illness.

## 2024-01-25 NOTE — Progress Notes (Signed)
Spirometry pre/post performed today. 

## 2024-01-25 NOTE — Patient Instructions (Signed)
Spirometry pre/post performed today. 

## 2024-01-25 NOTE — Progress Notes (Deleted)
  Electrophysiology Office Note:   Date:  01/25/2024  ID:  IMONI KOHEN, DOB 01-Sep-1960, MRN [id number]  Primary Cardiologist: Kardie Tobb, DO Primary Heart Failure: None Electrophysiologist: Will Gladis Norton, MD  {Click to update primary MD,subspecialty MD or APP then REFRESH:1}    History of Present Illness:   FIORELLA HANAHAN is a 63 y.o. female with h/o syncope, HTN, HLD, migraines, DM seen today for routine electrophysiology followup.   Recent move from [place], followed with Cardiology there in 05/2023 after an episode of syncope. She was started on florinef . She had a recurrent episode of syncope in 08/2023 while doing the laundry. She has had multiple episodes of syncope since she was a teen. She had a tilt table test in [place] which showed no change in HR or BP. She was given NTG and had a drop in BP but no syncope.  She was noted to have an appropriate increase in her HR and no pauses.  Seen by Dr. Norton 12/16/23 for evaluation of recurrent syncope.  Cardiac monitor ordered for evaluation.     Since last being seen in our clinic the patient reports doing ***.    She ***denies chest pain, palpitations, dyspnea, PND, orthopnea, nausea, vomiting, dizziness, syncope, edema, weight gain, or early satiety.   Review of systems complete and found to be negative unless listed in HPI.   EP Information / Studies Reviewed:    EKG is ordered today. Personal review as below.       Arrhythmia / AAD / Pertinent EP Studies Recurrent Syncope  Cardiac Monitor 12/16/23 >   Risk Assessment/Calculations:              Physical Exam:   VS:  There were no vitals taken for this visit.   Wt Readings from Last 3 Encounters:  01/25/24 215 lb (97.5 kg)  12/16/23 214 lb (97.1 kg)  12/16/23 209 lb (94.8 kg)     GEN: Well nourished, well developed in no acute distress NECK: No JVD; No carotid bruits CARDIAC: {EPRHYTHM:28826}, no murmurs, rubs, gallops RESPIRATORY:  Clear to auscultation without  rales, wheezing or rhonchi  ABDOMEN: Soft, non-tender, non-distended EXTREMITIES:  No edema; No deformity   ASSESSMENT AND PLAN:    Recurrent Syncope  Suspected vasovagal ***  -cardiac monitor showed *** -discussed hydration, getting to the ground if she has sensation of pre-syncope   Follow up with Dr. Norton {EPFOLLOW LE:71826}  Signed, Daphne Barrack, NP-C, AGACNP-BC [facility] - Electrophysiology  01/26/2024, 8:04 AM

## 2024-01-25 NOTE — Progress Notes (Signed)
 @Patient  ID: Rebekah Reynolds, female    DOB: [birth year], 63 y.o.   MRN: [id number]  Chief Complaint  Patient presents with   Sleep Apnea    Follow up     Referring provider: Gerome Brunet, DO  HPI: Discussed the use of AI scribe software for clinical note transcription with the patient, who gave verbal consent to proceed.  History of Present Illness Rebekah Reynolds is a 63 year old female with sleep apnea and recent bronchitis who presents for evaluation of breathing difficulties and sleep apnea test results.  She recently underwent a sleep apnea test and a lung function test. She is awaiting a CPAP machine (order has been placed already), having used one many years ago but not recently. Her breathing is described as 'awful' and she feels 'miserable' due to it. She hears herself wheezing or breathing heavily at night and during the day.  Two months ago, she had a severe case of bronchitis. Her breathing difficulties have worsened since then. She was given Trelegy by her primary care provider during the bronchitis episode but has not continued its use. Her breathing was better while commuting to [place] but worsened upon returning home to [place], [place].  She underwent an allergy panel which returned negative results. She does not smoke and is not exposed to smoke. She speculates that environmental factors might be contributing to her symptoms.  She reports that her recent sleep study showed an AHI of 12.5 events per hour, and that a previous study in 2020 showed an AHI of 25 events per hour. She questions the accuracy of the home sleep test compared to the in-lab test she had previously.  She experiences shortness of breath with minimal exertion, such as walking from her house to the car or shopping. Her legs are tight and painful, affecting her ability to exercise. She attributes some of her breathing difficulties to weight gain after discontinuing Ozempic, which she was using for  diabetes and weight loss. She is not currently on any diabetes medication.  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   OV 12/16/2023: [name] is a 63 year old female with obstructive sleep apnea who presents with a persistent cough. She was referred for evaluation of a persistent cough.   She experiences a persistent dry cough that occurs intermittently. She had an episode of bronchitis in June, treated by her primary care physician. She frequently had bronchitis before moving to [place] , where she lived for 16-17 years without respiratory issues. Since returning home last year, she has experienced a recurrence of bronchitis. There are no current symptoms of bronchitis, but there is concern about recurrence. She has no asthma or allergies.   She has obstructive sleep apnea, previously managed with a CPAP machine, which was recalled. She hears herself snore and breathe heavily, affecting her sleep quality. She wakes up multiple times during the night and feels miserable due to weight gain and its impact on her breathing and sleep. She lost 52 pounds on weight loss medication but regained 35 pounds, attributed to steroid use for chronic migraines.   She has a history of diabetes and chronic migraines, managed with medications including Inderal. She was previously on Ozempic for weight management. She has used inhalers in the past but does not recall the specific type. An inhaler was given during her bronchitis  treatment in June, but she does not remember the name. A sleep study in 2020 showed she stopped breathing 24-25 times per hour.    TEST/EVENTS :  HST 01/01/2024:  AHI 12.5/hr; O2 sat nadir 76% CT cors 06/2023 >> clear lungs HST 05/2018 AHI 25/h, low sat 82% 08/2023 AEC 100   Immunization History  Administered Date(s) Administered    PFIZER Comirnaty (Gray Top)Covid-19 Tri-Sucrose Vaccine 09/10/2020   PFIZER(Purple Top)SARS-COV-2 Vaccination 05/28/2019, 06/28/2019, 01/20/2020   Pfizer(Comirnaty )Fall Seasonal Vaccine 12 years and older 03/20/2022    Past Medical History:  Diagnosis Date   Arthritis    bilatera knees   Migraine headache    chronic migrains   Pre-diabetes     Tobacco History: Social History   Tobacco Use  Smoking Status Never  Smokeless Tobacco Never   Counseling given: Not Answered   Outpatient Medications Prior to Visit  Medication Sig Dispense Refill   acetaminophen  (TYLENOL ) 120 MG suppository Place 120 mg rectally every 4 (four) hours as needed.     acetaminophen -codeine (TYLENOL  #3) 300-30 MG tablet Take 2 tablets by mouth every 6 (six) hours as needed for moderate pain (pain score 4-6).     busPIRone (BUSPAR) 10 MG tablet Take 10 mg by mouth daily.     Butalbital-ASA-Caff-Codeine (ASCOMP-CODEINE PO) Take 1 tablet by mouth every 8 (eight) hours as needed. As needed for severe headache      cephALEXin  (KEFLEX ) 500 MG capsule Take 1 capsule (500 mg total) by mouth 4 (four) times daily. 20 capsule 0   Cholecalciferol 1.25 MG (50000 UT) capsule Take 50,000 Units by mouth daily.     COVID-19 mRNA Vac-TriS, Pfizer, (PFIZER-BIONT COVID-19 VAC-TRIS) SUSP injection Inject into the muscle. 0.3 mL 0   COVID-19 mRNA vaccine 2023-2024 (COMIRNATY ) syringe Inject into the muscle. 0.3 mL 0   doxycycline (VIBRAMYCIN) 100 MG capsule Take 100 mg by mouth 2 (two) times daily.     EMGALITY 120 MG/ML SOAJ once a week.     fludrocortisone  (FLORINEF ) 0.1 MG tablet Take 2 tablets (0.2 mg total) by mouth 2 (two) times daily. 360 tablet 3   HYDROcodone bit-homatropine (HYCODAN) 5-1.5 MG/5ML syrup Take 5 mLs by mouth every 6 (six) hours as needed.     ibuprofen  (ADVIL ,MOTRIN ) 800 MG tablet Take 1 tablet (800 mg total) by mouth every 8 (eight) hours as needed. 50 tablet 1   indomethacin (INDOCIN) 25 MG capsule Take  25 mg by mouth daily.     magnesium oxide (MAG-OX) 400 MG tablet Take 400 mg by mouth daily.     meclizine (ANTIVERT) 25 MG tablet Take 25 mg by mouth daily.     oxycodone -acetaminophen  (PERCOCET) 2.5-325 MG tablet Take 1 tablet by mouth every 4 (four) hours as needed for pain. 6 tablet 0   oxyCODONE -acetaminophen  (PERCOCET/ROXICET) 5-325 MG tablet Take 1 tablet by mouth every 6 (six) hours as needed for severe pain (pain score 7-10). 15 tablet 0   promethazine  (PHENERGAN ) 25 MG tablet Take 25 mg by mouth every 6 (six) hours as needed for nausea or vomiting.     propranolol (INDERAL) 80 MG tablet Take 80 mg by mouth every morning.      Riboflavin 400 MG TABS daily.     rosuvastatin (CRESTOR) 20 MG tablet Take 20 mg by mouth at bedtime.     Semaglutide, 2 MG/DOSE, (OZEMPIC, 2 MG/DOSE,) 8 MG/3ML SOPN once a week.     SUMAtriptan (IMITREX) 100 MG tablet Take 100 mg by mouth every  8 (eight) hours as needed. For migraine      SUMAtriptan 6 MG/0.5ML SOAJ as needed.     tamsulosin  (FLOMAX ) 0.4 MG CAPS capsule Take 1 capsule (0.4 mg total) by mouth daily. 15 capsule 0   traMADol (ULTRAM) 50 MG tablet Take 50 mg by mouth every 6 (six) hours as needed for moderate pain.     UBRELVY 100 MG TABS as needed.     Vitamin D, Ergocalciferol, (DRISDOL) 50000 units CAPS capsule Take 50,000 Units by mouth once a week.  1   meloxicam (MOBIC) 15 MG tablet Take 15 mg by mouth daily. (Patient not taking: Reported on 12/16/2023)  3   metFORMIN (GLUCOPHAGE-XR) 500 MG 24 hr tablet Take 500 mg by mouth every morning. (Patient not taking: Reported on 12/16/2023)  3   No facility-administered medications prior to visit.     Review of Systems: as per hPI  Constitutional:   No  weight loss, night sweats,  Fevers, chills, fatigue, or  lassitude.  HEENT:   No headaches,  Difficulty swallowing,  Tooth/dental problems, or  Sore throat,                No sneezing, itching, ear ache, nasal congestion, post nasal drip,   CV:   No chest pain,  Orthopnea, PND, swelling in lower extremities, anasarca, dizziness, palpitations, syncope.   GI  No heartburn, indigestion, abdominal pain, nausea, vomiting, diarrhea, change in bowel habits, loss of appetite, bloody stools.   Resp: No shortness of breath with exertion or at rest.  No excess mucus, no productive cough,  No non-productive cough,  No coughing up of blood.  No change in color of mucus.  No wheezing.  No chest wall deformity  Skin: no rash or lesions.  GU: no dysuria, change in color of urine, no urgency or frequency.  No flank pain, no hematuria   MS:  No joint pain or swelling.  No decreased range of motion.  No back pain.    Physical Exam  BP (!) 158/93   Pulse 82   Ht 5' 2 (1.575 m)   Wt 215 lb (97.5 kg)   SpO2 100%   BMI 39.32 kg/m   GEN: A/Ox3; pleasant , NAD, well nourished    HEENT:  [place]/AT,  EACs-clear, TMs-wnl, NOSE-clear, THROAT-clear, no lesions, no postnasal drip or exudate noted. Mallampati 3  NECK:  Supple w/ fair ROM; no JVD; normal carotid impulses w/o bruits; no thyromegaly or nodules palpated; no lymphadenopathy.    RESP  Clear  P & A; w/o, wheezes/ rales/ or rhonchi. no accessory muscle use, no dullness to percussion  CARD:  RRR, no m/r/g, no peripheral edema, pulses intact, no cyanosis or clubbing.  GI:   obese, soft & nt; nml bowel sounds; no organomegaly or masses detected.   Musco: Warm bil, no deformities or joint swelling noted.   Neuro: alert, no focal deficits noted.    Skin: Warm, no lesions or rashes    Lab Results:  CBC    Component Value Date/Time   WBC 8.0 08/22/2023 1002   RBC 3.84 (L) 08/22/2023 1002   HGB 12.1 08/22/2023 1002   HCT 36.6 08/22/2023 1002   PLT 355 08/22/2023 1002   MCV 95.3 08/22/2023 1002   MCH 31.5 08/22/2023 1002   MCHC 33.1 08/22/2023 1002   RDW 13.3 08/22/2023 1002   LYMPHSABS 2.3 08/22/2023 1002   MONOABS 0.5 08/22/2023 1002   EOSABS 0.1 08/22/2023 1002   BASOSABS  0.0  08/22/2023 1002    BMET    Component Value Date/Time   NA 140 08/22/2023 1002   NA 143 06/15/2023 0940   K 4.1 08/22/2023 1002   CL 103 08/22/2023 1002   CO2 26 08/22/2023 1002   GLUCOSE 101 (H) 08/22/2023 1002   BUN 14 08/22/2023 1002   BUN 12 06/15/2023 0940   CREATININE 1.05 (H) 08/22/2023 1002   CALCIUM 9.9 08/22/2023 1002   GFRNONAA 60 (L) 08/22/2023 1002   GFRAA >60 05/12/2017 0615    BNP No results found for: BNP  ProBNP No results found for: PROBNP  Imaging: No results found.  Administration History     None          Latest Ref Rng & Units 01/25/2024    8:58 AM  PFT Results  FVC-Pre L 2.32  P  FVC-Predicted Pre % 77  P  FVC-Post L 2.26  P  FVC-Predicted Post % 75  P  Pre FEV1/FVC % % 88  P  Post FEV1/FCV % % 91  P  FEV1-Pre L 2.05  P  FEV1-Predicted Pre % 89  P  FEV1-Post L 2.06  P    P Preliminary result    No results found for: NITRICOXIDE   Assessment & Plan:   Assessment & Plan Mild persistent asthma without complication  SOB (shortness of breath)  OSA (obstructive sleep apnea)  Assessment and Plan Assessment & Plan Obstructive sleep apnea Mild obstructive sleep apnea with AHI of 12.5 events/hour, improved from 25 events/hour in 2020. Symptoms include snoring and daytime fatigue. Auto CPAP machine ordered. - Use auto CPAP machine as prescribed. Goal of usage at least 4-6 hours per night. - Reassess compliance and effectiveness of CPAP within 31-90 days.  Recurrent bronchitis with dyspnea on exertion Recurrent bronchitis with dyspnea on exertion, exacerbated by weight gain and environmental factors. Negative allergy panel, normal IgE levels. Spirometry shows more of a restrictive pattern; formal read pending.  May be better evaluated with lung volumes and DLCO measurement.  Mild bronchodilator response. Discussed SMART therapy and use of Albuterol. Weight management and sleep apnea control may improve symptoms. Trial of: -  Rescue inhaler for shortness of breath. - Airsupra for bronchitis exacerbations. - Implement SMART therapy for bronchitis management. - Reassess inhaler effectiveness at CPAP compliance follow up.    Return in about 8 weeks (around 03/21/2024) for compliance download.  Candis Dandy, PA-C 01/25/2024

## 2024-01-27 ENCOUNTER — Ambulatory Visit: Admitting: Pulmonary Disease

## 2024-01-27 DIAGNOSIS — R55 Syncope and collapse: Secondary | ICD-10-CM

## 2024-02-03 ENCOUNTER — Ambulatory Visit (HOSPITAL_BASED_OUTPATIENT_CLINIC_OR_DEPARTMENT_OTHER): Admitting: Pulmonary Disease

## 2024-02-24 ENCOUNTER — Encounter: Payer: Self-pay | Admitting: "Endocrinology

## 2024-02-24 ENCOUNTER — Ambulatory Visit: Admitting: "Endocrinology

## 2024-02-24 VITALS — BP 112/68 | HR 96 | Resp 18 | Ht 62.0 in | Wt 217.0 lb

## 2024-02-24 DIAGNOSIS — E1165 Type 2 diabetes mellitus with hyperglycemia: Secondary | ICD-10-CM | POA: Insufficient documentation

## 2024-02-24 DIAGNOSIS — Z6839 Body mass index (BMI) 39.0-39.9, adult: Secondary | ICD-10-CM | POA: Diagnosis not present

## 2024-02-24 DIAGNOSIS — E782 Mixed hyperlipidemia: Secondary | ICD-10-CM | POA: Insufficient documentation

## 2024-02-24 DIAGNOSIS — E66812 Obesity, class 2: Secondary | ICD-10-CM | POA: Diagnosis not present

## 2024-02-24 DIAGNOSIS — Z7985 Long-term (current) use of injectable non-insulin antidiabetic drugs: Secondary | ICD-10-CM | POA: Diagnosis not present

## 2024-02-24 LAB — POCT GLYCOSYLATED HEMOGLOBIN (HGB A1C): Hemoglobin A1C: 6.9 % — AB (ref 4.0–5.6)

## 2024-02-24 MED ORDER — TIRZEPATIDE 2.5 MG/0.5ML ~~LOC~~ SOAJ: 2.5000 mg | SUBCUTANEOUS | 0 refills | Status: AC

## 2024-02-24 NOTE — Patient Instructions (Signed)

## 2024-02-24 NOTE — Progress Notes (Signed)
 Endocrinology Consult Note       02/24/2024, 9:22 AM   Subjective:    Patient ID: Rebekah Reynolds, female    DOB: 1961/01/06.  Carly D Ardito is being seen in consultation for management of currently uncontrolled symptomatic diabetes requested by  Gerome Brunet, DO.   Past Medical History:  Diagnosis Date   Arthritis    bilatera knees   Migraine headache    chronic migrains   Pre-diabetes     Past Surgical History:  Procedure Laterality Date   BREAST SURGERY  1982   benign right breast cyst removed   DILATATION & CURRETTAGE/HYSTEROSCOPY WITH RESECTOCOPE N/A 05/12/2017   Procedure: DILATATION & CURETTAGE/HYSTEROSCOPY WITH RESECTOCOPE;  Surgeon: Manda Fess, MD;  Location: [facility];  Service: Gynecology;  Laterality: N/A;    Social History   Socioeconomic History   Marital status: Single    Spouse name: Not on file   Number of children: Not on file   Years of education: Not on file   Highest education level: Not on file  Occupational History   Not on file  Tobacco Use   Smoking status: Never   Smokeless tobacco: Never  Vaping Use   Vaping status: Never Used  Substance and Sexual Activity   Alcohol use: Yes    Alcohol/week: 2.0 standard drinks of alcohol    Types: 1 Glasses of wine, 1 Shots of liquor per week    Comment: rare   Drug use: No   Sexual activity: Not on file  Other Topics Concern   Not on file  Social History Narrative   Not on file   Social Drivers of Health   Financial Resource Strain: Not on file  Food Insecurity: Not on file  Transportation Needs: Not on file  Physical Activity: Not on file  Stress: Not on file  Social Connections: Not on file    History reviewed. No pertinent family history.  Outpatient Encounter Medications as of 02/24/2024  Medication Sig   acetaminophen  (TYLENOL ) 120 MG suppository Place 120 mg rectally every 4 (four) hours as  needed.   acetaminophen -codeine (TYLENOL  #3) 300-30 MG tablet Take 2 tablets by mouth every 6 (six) hours as needed for moderate pain (pain score 4-6).   albuterol  (VENTOLIN  HFA) 108 (90 Base) MCG/ACT inhaler Inhale 2 puffs into the lungs every 6 (six) hours as needed for wheezing or shortness of breath.   Albuterol -Budesonide (AIRSUPRA ) 90-80 MCG/ACT AERO Inhale 2 puffs into the lungs every 6 (six) hours as needed. Use as needed during upper respiratory infection; worsening of asthma   busPIRone (BUSPAR) 10 MG tablet Take 10 mg by mouth daily.   Butalbital-ASA-Caff-Codeine (ASCOMP-CODEINE PO) Take 1 tablet by mouth every 8 (eight) hours as needed. As needed for severe headache    Cholecalciferol 1.25 MG (50000 UT) capsule Take 50,000 Units by mouth daily.   COVID-19 mRNA Vac-TriS, Pfizer, (PFIZER-BIONT COVID-19 VAC-TRIS) SUSP injection Inject into the muscle.   COVID-19 mRNA vaccine 2023-2024 (COMIRNATY ) syringe Inject into the muscle.   diclofenac (VOLTAREN) 75 MG EC tablet Take 75 mg by mouth daily as needed.   doxycycline (VIBRAMYCIN) 100  MG capsule Take 100 mg by mouth 2 (two) times daily.   DULoxetine (CYMBALTA) 60 MG capsule Take 60 mg by mouth as needed.   EMGALITY 120 MG/ML SOAJ once a week.   fludrocortisone  (FLORINEF ) 0.1 MG tablet Take 2 tablets (0.2 mg total) by mouth 2 (two) times daily.   HYDROcodone bit-homatropine (HYCODAN) 5-1.5 MG/5ML syrup Take 5 mLs by mouth every 6 (six) hours as needed.   ibuprofen  (ADVIL ,MOTRIN ) 800 MG tablet Take 1 tablet (800 mg total) by mouth every 8 (eight) hours as needed.   indomethacin (INDOCIN) 25 MG capsule Take 25 mg by mouth daily.   magnesium oxide (MAG-OX) 400 MG tablet Take 400 mg by mouth daily.   meclizine (ANTIVERT) 25 MG tablet Take 25 mg by mouth daily.   ondansetron  (ZOFRAN ) 4 MG tablet Take 4 mg by mouth every 8 (eight) hours as needed.   oxycodone -acetaminophen  (PERCOCET) 2.5-325 MG tablet Take 1 tablet by mouth every 4 (four) hours  as needed for pain.   oxyCODONE -acetaminophen  (PERCOCET/ROXICET) 5-325 MG tablet Take 1 tablet by mouth every 6 (six) hours as needed for severe pain (pain score 7-10).   promethazine  (PHENERGAN ) 25 MG tablet Take 25 mg by mouth every 6 (six) hours as needed for nausea or vomiting.   propranolol (INDERAL) 80 MG tablet Take 80 mg by mouth every morning.    Riboflavin 400 MG TABS daily.   rosuvastatin (CRESTOR) 20 MG tablet Take 20 mg by mouth at bedtime.   SUMAtriptan (IMITREX) 100 MG tablet Take 100 mg by mouth every 8 (eight) hours as needed. For migraine    SUMAtriptan 6 MG/0.5ML SOAJ as needed.   tirzepatide (MOUNJARO) 2.5 MG/0.5ML Pen Inject 2.5 mg into the skin once a week.   traMADol (ULTRAM) 50 MG tablet Take 50 mg by mouth every 6 (six) hours as needed for moderate pain.   traZODone (DESYREL) 50 MG tablet Take 50 mg by mouth at bedtime as needed.   UBRELVY 100 MG TABS as needed.   Vitamin D, Ergocalciferol, (DRISDOL) 50000 units CAPS capsule Take 50,000 Units by mouth once a week.   [DISCONTINUED] Semaglutide, 2 MG/DOSE, (OZEMPIC, 2 MG/DOSE,) 8 MG/3ML SOPN once a week.   [DISCONTINUED] cephALEXin  (KEFLEX ) 500 MG capsule Take 1 capsule (500 mg total) by mouth 4 (four) times daily.   [DISCONTINUED] tamsulosin  (FLOMAX ) 0.4 MG CAPS capsule Take 1 capsule (0.4 mg total) by mouth daily.   No facility-administered encounter medications on file as of 02/24/2024.    ALLERGIES: No Known Allergies  VACCINATION STATUS: Immunization History  Administered Date(s) Administered   PFIZER Comirnaty [place]jos Top)Covid-19 Tri-Sucrose Vaccine 09/10/2020   PFIZER(Purple Top)SARS-COV-2 Vaccination 05/28/2019, 06/28/2019, 01/20/2020   Pfizer(Comirnaty )Fall Seasonal Vaccine 12 years and older 03/20/2022    Diabetes She presents for her initial diabetic visit. She has type 2 diabetes mellitus. Onset time: She was diagnosed at approximate age of 63 yrs. There are no hypoglycemic associated symptoms.  Pertinent negatives for hypoglycemia include no confusion, headaches, pallor or seizures. There are no diabetic associated symptoms. Pertinent negatives for diabetes include no chest pain, no polydipsia, no polyphagia and no polyuria. There are no hypoglycemic complications. Symptoms are stable. There are no diabetic complications. Risk factors for coronary artery disease include dyslipidemia, diabetes mellitus and obesity. When asked about current treatments, none were reported. Her weight is fluctuating minimally. She has not had a previous visit with a dietitian. She participates in exercise intermittently. (She did not bring logs nor meter.  Her point-of-care A1c was  6.9%, increasing.) An ACE inhibitor/angiotensin II receptor blocker is not being taken.  Hyperlipidemia This is a chronic problem. Exacerbating diseases include diabetes and obesity. Pertinent negatives include no chest pain, myalgias or shortness of breath. Current antihyperlipidemic treatment includes statins. Risk factors for coronary artery disease include dyslipidemia, diabetes mellitus and obesity.     Review of Systems  Constitutional:  Negative for chills, fever and unexpected weight change.  HENT:  Negative for trouble swallowing and voice change.   Eyes:  Negative for visual disturbance.  Respiratory:  Negative for cough, shortness of breath and wheezing.   Cardiovascular:  Negative for chest pain, palpitations and leg swelling.  Gastrointestinal:  Negative for diarrhea, nausea and vomiting.  Endocrine: Negative for cold intolerance, heat intolerance, polydipsia, polyphagia and polyuria.  Musculoskeletal:  Negative for arthralgias and myalgias.  Skin:  Negative for color change, pallor, rash and wound.  Neurological:  Negative for seizures and headaches.  Psychiatric/Behavioral:  Negative for confusion and suicidal ideas.     Objective:       02/24/2024    8:35 AM 01/25/2024   10:35 AM 01/25/2024    9:45 AM  Vitals  with BMI  Height 5' 2  5' 2  Weight 217 lbs  215 lbs  BMI 39.68  39.31  Systolic 112 166 841  Diastolic 68 77 93  Pulse 96  82    BP 112/68   Pulse 96   Resp 18   Ht 5' 2 (1.575 m)   Wt 217 lb (98.4 kg)   SpO2 99%   BMI 39.69 kg/m   Wt Readings from Last 3 Encounters:  02/24/24 217 lb (98.4 kg)  01/25/24 215 lb (97.5 kg)  12/16/23 214 lb (97.1 kg)     Physical Exam Constitutional:      Appearance: She is well-developed.  HENT:     Head: Normocephalic and atraumatic.  Neck:     Thyroid: No thyromegaly.     Trachea: No tracheal deviation.  Cardiovascular:     Rate and Rhythm: Normal rate and regular rhythm.  Pulmonary:     Effort: Pulmonary effort is normal.     Breath sounds: Normal breath sounds.  Abdominal:     Tenderness: There is no abdominal tenderness. There is no guarding.  Musculoskeletal:        General: Normal range of motion.     Cervical back: Normal range of motion and neck supple.  Skin:    General: Skin is warm and dry.     Coloration: Skin is not pale.     Findings: No erythema or rash.  Neurological:     Mental Status: She is alert and oriented to person, place, and time.     Cranial Nerves: No cranial nerve deficit.     Coordination: Coordination normal.     Deep Tendon Reflexes: Reflexes are normal and symmetric.  Psychiatric:        Judgment: Judgment normal.       CMP ( most recent) CMP     Component Value Date/Time   NA 140 08/22/2023 1002   NA 143 06/15/2023 0940   K 4.1 08/22/2023 1002   CL 103 08/22/2023 1002   CO2 26 08/22/2023 1002   GLUCOSE 101 (H) 08/22/2023 1002   BUN 14 08/22/2023 1002   BUN 12 06/15/2023 0940   CREATININE 1.05 (H) 08/22/2023 1002   CALCIUM 9.9 08/22/2023 1002   PROT 6.7 06/15/2023 0940   ALBUMIN 4.2 06/15/2023 0940  AST 15 06/15/2023 0940   ALT 11 06/15/2023 0940   ALKPHOS 84 06/15/2023 0940   BILITOT 0.3 06/15/2023 0940   EGFR 61 06/15/2023 0940   GFRNONAA 60 (L) 08/22/2023 1002      Lab Results  Component Value Date   HGBA1C 5.2 06/15/2023       Assessment & Plan:   1. Type 2 diabetes mellitus with hyperglycemia, without long-term current use of insulin (HCC) (Primary)  - Jhonny D Steelman has currently uncontrolled symptomatic type 2 DM since  63 years of age,  with most recent A1c of 6.9 %. Recent labs reviewed. - I had a long discussion with her about the possible risk factors and  the pathology behind diabetes and its complications. -her diabetes is complicated by obesity, comorbid hyperlipidemia and she remains at a high risk for more acute and chronic complications which include CAD, CVA, CKD, retinopathy, and neuropathy. These are all discussed in detail with her.  - I discussed all available options of managing her diabetes including de-escalation of medications.  Patient is encouraged to switch to  unprocessed or minimally processed  complex starch, adequate protein intake (mainly plant source), minimal liquid fat, plenty of fruits, and vegetables. -  she is advised to stick to a routine mealtimes to eat 3 complete meals a day and snack only when necessary (to snack only to correct hypoglycemia BG <70 day time or <100 at night).   - she acknowledges that there is a room for improvement in her food and drink choices. - Further Specific Suggestion is made for her to avoid simple carbohydrates  from her diet including Cakes, Sweet Desserts, Ice Cream, Soda (diet and regular), Sweet Tea, Candies, Chips, Cookies, Store Bought Juices, Alcohol ,  Artificial Sweeteners,  Coffee Creamer, and Sugar-free Products. This will help patient to have more stable blood glucose profile and potentially avoid unintended weight gain.   - she will be scheduled with Penny Crumpton, RDN, CDE for individualized diabetes education.  - I have approached her with the following individualized plan to manage  her diabetes and patient agrees:   - Considering increasing A1c and  need to address body weight, she will benefit from resumption of GLP-1 receptor agonists.  I discussed initiating Mounjaro 2.5 mg weekly.  Side effects and precautions discussed with her. This medication will be advanced as she tolerates.  - She would like to avoid ointment for now. -She is encouraged to monitor blood glucose and fasting, and encouraged to call clinic if she registers fasting blood glucose greater than 200 mg per DL.   - Specific targets for  A1c;  LDL, HDL,  and Triglycerides were discussed with the patient.  2) Blood Pressure /Hypertension:  her blood pressure is  controlled to target.   3) Lipids/Hyperlipidemia: She does not have recent lipid panel to review.  She will have fasting lipid panel before next visit.  She is advised to continue Crestor 20 mg p.o. nightly.    4)  Weight/Diet:  Body mass index is 39.69 kg/m.  -   clearly complicating her diabetes care.   she is  a candidate for weight loss.   5) Chronic Care/Health Maintenance:  -she  is on Statin medications and  is encouraged to initiate and continue to follow up with Ophthalmology, Dentist,  Podiatrist at least yearly or according to recommendations, and advised to  stay away from smoking. I have recommended yearly flu vaccine and pneumonia vaccine at least every 5 years;  moderate intensity exercise for up to 150 minutes weekly; and  sleep for 7- 9 hours a day.  - she is  advised to maintain close follow up with Collins, Dana, DO for primary care needs, as well as her other providers for optimal and coordinated care.   Thank you for involving me in the care of this pleasant patient.  I spent  60  minutes in the care of the patient today including review of labs from CMP, Lipids, Thyroid Function, Hematology (current and previous including abstractions from other facilities); face-to-face time discussing  her blood glucose readings/logs, discussing hypoglycemia and hyperglycemia episodes and symptoms, medications  doses, her options of short and long term treatment based on the latest standards of care / guidelines;  discussion about incorporating lifestyle medicine;  and documenting the encounter. Risk reduction counseling performed per USPSTF guidelines to reduce  obesity and cardiovascular risk factors.      Please refer to Patient Instructions for Blood Glucose Monitoring and Insulin/Medications Dosing Guide  in media tab for additional information. Please  also refer to  Patient Self Inventory in the Media  tab for reviewed elements of pertinent patient history.  Sequoia D Haymore participated in the discussions, expressed understanding, and voiced agreement with the above plans.  All questions were answered to her satisfaction. she is encouraged to contact clinic should she have any questions or concerns prior to her return visit.   Follow up plan: - Return in about 3 months (around 05/24/2024) for F/U with Pre-visit Labs, Meter/CGM/Logs, A1c here.  Ranny Earl, MD [facility] [address] [place], [place] [address] Phone: [phone]  Fax: [phone]    02/24/2024, 9:22 AM  This note was partially dictated with voice recognition software. Similar sounding words can be transcribed inadequately or may not  be corrected upon review.

## 2024-02-25 ENCOUNTER — Telehealth (HOSPITAL_BASED_OUTPATIENT_CLINIC_OR_DEPARTMENT_OTHER): Payer: Self-pay

## 2024-02-25 NOTE — Telephone Encounter (Signed)
 CMN received for CPAP supplies sent to [facility] signed by provider and faxed confirmation received

## 2024-02-28 ENCOUNTER — Telehealth: Payer: Self-pay

## 2024-02-28 ENCOUNTER — Encounter: Payer: Self-pay | Admitting: "Endocrinology

## 2024-02-28 NOTE — Telephone Encounter (Signed)
 Received notification pt's Mounjaro  2.5mg  requires a prior auth.

## 2024-02-29 ENCOUNTER — Telehealth: Payer: Self-pay

## 2024-02-29 ENCOUNTER — Other Ambulatory Visit (HOSPITAL_COMMUNITY): Payer: Self-pay

## 2024-02-29 NOTE — Telephone Encounter (Signed)
 Pharmacy Patient Advocate Encounter   Received notification from Pt Calls Messages that prior authorization for Mounjaro  2.5MG /0.5ML auto-injectors is required/requested.   Insurance verification completed.   The patient is insured through [facility].   Per test claim: PA required; PA started via CoverMyMeds. KEY [id number] . Waiting for clinical questions to populate.

## 2024-03-02 ENCOUNTER — Encounter: Payer: Self-pay | Admitting: "Endocrinology

## 2024-03-03 ENCOUNTER — Telehealth: Payer: Self-pay

## 2024-03-03 ENCOUNTER — Other Ambulatory Visit (HOSPITAL_COMMUNITY): Payer: Self-pay

## 2024-03-03 ENCOUNTER — Encounter: Payer: Self-pay | Admitting: "Endocrinology

## 2024-03-03 NOTE — Telephone Encounter (Signed)
 Notified per pt Rx for Mounjaro  needs prior auth. The KEY Code to resubmit is [id number]   The direct line for providers to contact them is per pt.  [phone].

## 2024-03-03 NOTE — Telephone Encounter (Signed)
 Pharmacy Patient Advocate Encounter  Received notification from Office that Prior Authorization for Mounjaro  has been CANCELLED by the processor we sent it to: Resubmitted through Latent to [facility]   PA #/Case ID/Reference #: [id number]

## 2024-03-13 NOTE — Telephone Encounter (Signed)
 Pharmacy Patient Advocate Encounter  Received notification from [facility] that Prior Authorization for Mounjaro  2.5MG /0.5ML auto-injectors  has been APPROVED from 03/03/2024 to 03/04/2027   PA #/Case ID/Reference #: [id number]

## 2024-03-14 ENCOUNTER — Ambulatory Visit: Attending: Cardiology | Admitting: Cardiology

## 2024-03-21 ENCOUNTER — Encounter: Payer: Self-pay | Admitting: "Endocrinology

## 2024-03-22 ENCOUNTER — Encounter: Admitting: Nutrition

## 2024-03-30 ENCOUNTER — Encounter: Payer: Self-pay | Admitting: Cardiology

## 2024-04-04 ENCOUNTER — Ambulatory Visit: Admitting: "Endocrinology

## 2024-04-11 ENCOUNTER — Ambulatory Visit (HOSPITAL_BASED_OUTPATIENT_CLINIC_OR_DEPARTMENT_OTHER): Payer: Self-pay

## 2024-04-11 ENCOUNTER — Encounter: Admitting: Nutrition

## 2024-04-11 ENCOUNTER — Encounter: Payer: Self-pay | Admitting: "Endocrinology

## 2024-05-10 ENCOUNTER — Ambulatory Visit (HOSPITAL_BASED_OUTPATIENT_CLINIC_OR_DEPARTMENT_OTHER)

## 2024-05-26 ENCOUNTER — Ambulatory Visit: Admitting: "Endocrinology
